# Patient Record
Sex: Male | Born: 1935 | Race: White | Hispanic: No | Marital: Married | Smoking: Former smoker
Health system: Southern US, Community
[De-identification: ages and names within clinical notes are randomized; demographics above are authoritative.]

## PROBLEM LIST (undated history)

## (undated) DIAGNOSIS — E039 Hypothyroidism, unspecified: Secondary | ICD-10-CM

## (undated) DIAGNOSIS — F419 Anxiety disorder, unspecified: Secondary | ICD-10-CM

## (undated) DIAGNOSIS — R9439 Abnormal result of other cardiovascular function study: Secondary | ICD-10-CM

## (undated) DIAGNOSIS — F329 Major depressive disorder, single episode, unspecified: Secondary | ICD-10-CM

## (undated) DIAGNOSIS — M1990 Unspecified osteoarthritis, unspecified site: Secondary | ICD-10-CM

## (undated) DIAGNOSIS — K219 Gastro-esophageal reflux disease without esophagitis: Secondary | ICD-10-CM

## (undated) DIAGNOSIS — E785 Hyperlipidemia, unspecified: Secondary | ICD-10-CM

## (undated) DIAGNOSIS — I10 Essential (primary) hypertension: Secondary | ICD-10-CM

## (undated) DIAGNOSIS — F32A Depression, unspecified: Secondary | ICD-10-CM

## (undated) DIAGNOSIS — I639 Cerebral infarction, unspecified: Secondary | ICD-10-CM

## (undated) HISTORY — DX: Abnormal result of other cardiovascular function study: R94.39

## (undated) HISTORY — DX: Hyperlipidemia, unspecified: E78.5

## (undated) HISTORY — DX: Hypothyroidism, unspecified: E03.9

## (undated) HISTORY — DX: Essential (primary) hypertension: I10

## (undated) HISTORY — DX: Unspecified osteoarthritis, unspecified site: M19.90

---

## 2010-02-04 ENCOUNTER — Ambulatory Visit: Payer: Self-pay | Admitting: Surgery

## 2010-02-04 ENCOUNTER — Ambulatory Visit: Admission: RE | Admit: 2010-02-04 | Discharge: 2010-02-04 | Payer: Self-pay | Admitting: Orthopedic Surgery

## 2010-02-17 DIAGNOSIS — R9439 Abnormal result of other cardiovascular function study: Secondary | ICD-10-CM

## 2010-02-17 HISTORY — PX: POSTERIOR LAMINECTOMY / DECOMPRESSION LUMBAR SPINE: SUR740

## 2010-02-17 HISTORY — DX: Abnormal result of other cardiovascular function study: R94.39

## 2010-03-18 ENCOUNTER — Observation Stay (HOSPITAL_COMMUNITY): Admission: RE | Admit: 2010-03-18 | Discharge: 2010-03-19 | Payer: Self-pay | Admitting: Orthopedic Surgery

## 2010-12-05 LAB — COMPREHENSIVE METABOLIC PANEL
BUN: 32 mg/dL — ABNORMAL HIGH (ref 6–23)
CO2: 25 mEq/L (ref 19–32)
GFR calc Af Amer: 47 mL/min — ABNORMAL LOW (ref >60)
Glucose, Bld: 112 mg/dL — ABNORMAL HIGH (ref 70–99)
Potassium: 4.7 mEq/L (ref 3.5–5.1)
Sodium: 140 mEq/L (ref 135–145)

## 2010-12-05 LAB — CBC
MCV: 98.1 fL (ref 78.0–100.0)
Platelets: 163 10*3/uL (ref 150–400)
RDW: 13.4 % (ref 11.5–15.5)
WBC: 7.4 10*3/uL (ref 4.0–10.5)

## 2010-12-05 LAB — SURGICAL PCR SCREEN: Staphylococcus aureus: NEGATIVE

## 2013-05-08 ENCOUNTER — Encounter: Payer: Self-pay | Admitting: Cardiology

## 2013-05-09 ENCOUNTER — Encounter: Payer: Self-pay | Admitting: Cardiovascular Disease

## 2013-05-10 ENCOUNTER — Encounter: Payer: Self-pay | Admitting: Cardiovascular Disease

## 2013-05-10 ENCOUNTER — Ambulatory Visit (INDEPENDENT_AMBULATORY_CARE_PROVIDER_SITE_OTHER): Payer: Medicare Other | Admitting: Cardiovascular Disease

## 2013-05-10 VITALS — BP 120/82 | HR 69 | Ht 67.0 in | Wt 174.4 lb

## 2013-05-10 DIAGNOSIS — Z01810 Encounter for preprocedural cardiovascular examination: Secondary | ICD-10-CM

## 2013-05-10 DIAGNOSIS — M109 Gout, unspecified: Secondary | ICD-10-CM

## 2013-05-10 DIAGNOSIS — I10 Essential (primary) hypertension: Secondary | ICD-10-CM

## 2013-05-10 DIAGNOSIS — E785 Hyperlipidemia, unspecified: Secondary | ICD-10-CM

## 2013-05-10 NOTE — Patient Instructions (Addendum)
Your physician recommends that you schedule a follow-up appointment in: AS NEEDED  Surgical clearance has been faxed to your surgeon.

## 2013-05-14 ENCOUNTER — Encounter: Payer: Self-pay | Admitting: Cardiovascular Disease

## 2013-05-14 ENCOUNTER — Encounter (HOSPITAL_COMMUNITY): Payer: Self-pay | Admitting: Pharmacy Technician

## 2013-05-14 DIAGNOSIS — E785 Hyperlipidemia, unspecified: Secondary | ICD-10-CM | POA: Insufficient documentation

## 2013-05-14 DIAGNOSIS — M109 Gout, unspecified: Secondary | ICD-10-CM | POA: Insufficient documentation

## 2013-05-14 DIAGNOSIS — Z01810 Encounter for preprocedural cardiovascular examination: Secondary | ICD-10-CM | POA: Insufficient documentation

## 2013-05-14 DIAGNOSIS — I10 Essential (primary) hypertension: Secondary | ICD-10-CM | POA: Insufficient documentation

## 2013-05-14 NOTE — Assessment & Plan Note (Signed)
From a cardiovascular standpoint the plan surgery is low risk. He did quite well with a similar surgery performed 3 years ago. In the interval has been quite active despite his orthopedic limitations. He has no symptoms of shortness of breath or chest pain. Of note previous attempts at treatment with a beta blocker led to dizziness even when a very low dose was used. Low risk of cardiovascular complications of the planned surgery. I do not think additional evaluation is necessary.

## 2013-05-14 NOTE — Assessment & Plan Note (Signed)
Excellent control.   

## 2013-05-14 NOTE — Progress Notes (Signed)
Patient ID: Gilbert Maxwell, male   DOB: 01-23-1933, 79 y.o.   MRN: [id number]     Reason for office visit Reoperative cardiovascular exam, hypertension, hyperlipidemia  This is Gilbert Maxwell's first visit to our office in over 3 years. Was last seen by Dr. Lynnea Ferrier on 04/14/2010.  At that time, he was seen as a preoperative cardiovascular exam before lumbar spine surgery. He underwent a two-level decompression at L4-L5 and L5-S1 without any cardiac events. Around that time he underwent a stress nuclear perfusion study that demonstrated possible mild reversible inferior wall ischemia but the findings were felt to be low risk and coronary angiography was not performed. An echocardiogram was also done and showed normal findings. He has well treated hypertension and hyperlipidemia  Since 2011 Gilbert Maxwell has been a very active. His back surgery worked quite well and he has been walking daily and mowing his lawn until recently when he has started having back problems again. It seems to require surgery a little bit higher up in his lumbar spine again. He did not have any problems with shortness of breath or chest pain over the last 3 years.    Allergies  Allergen Reactions  . Ace Inhibitors Nausea Only  . Beta Adrenergic Blockers Nausea Only    dizzy    Current Outpatient Prescriptions  Medication Sig Dispense Refill  . aspirin 81 MG tablet Take 81 mg by mouth daily.      . calcium carbonate 1250 MG capsule Take 1,250 mg by mouth daily.       . fish oil-omega-3 fatty acids 1000 MG capsule Take 1 g by mouth daily.      . hydrochlorothiazide (HYDRODIURIL) 25 MG tablet Take 25 mg by mouth every morning.       Marland Kitchen ibuprofen (ADVIL,MOTRIN) 200 MG tablet Take 200 mg by mouth every 6 (six) hours as needed for pain.      Marland Kitchen levothyroxine (SYNTHROID, LEVOTHROID) 112 MCG tablet Take 112 mcg by mouth daily before breakfast.      . Multiple Vitamin (MULTIVITAMIN) tablet Take 1 tablet by mouth daily.      Marland Kitchen  olmesartan (BENICAR) 40 MG tablet Take 40 mg by mouth every morning.       Marland Kitchen acetaminophen (TYLENOL) 500 MG tablet Take 500 mg by mouth every 6 (six) hours as needed for pain.      Marland Kitchen atorvastatin (LIPITOR) 20 MG tablet Take 20 mg by mouth every morning.       No current facility-administered medications for this visit.    Past Medical History  Diagnosis Date  . HTN (hypertension)   . Dyslipidemia   . DJD (degenerative joint disease)   . Abnormal cardiovascular stress test 6/11    No symptoms- medical Rx  . Hypothyroid     Past Surgical History  Procedure Laterality Date  . Posterior laminectomy / decompression lumbar spine  June 2011    Family History  Problem Relation Age of Onset  . Heart disease Father     died 31  . Cancer Mother     died 45    History   Social History  . Marital Status: Married    Spouse Name: N/A    Number of Children: N/A  . Years of Education: N/A   Occupational History  . Not on file.   Social History Main Topics  . Smoking status: Former Games developer  . Smokeless tobacco: Not on file  . Alcohol Use: Yes  Comment: 1-2 daily  . Drug Use: No  . Sexual Activity: Not on file   Other Topics Concern  . Not on file   Social History Narrative  . No narrative on file    Review of systems: The patient specifically denies any chest pain at rest or with exertion, dyspnea at rest or with exertion, orthopnea, paroxysmal nocturnal dyspnea, syncope, palpitations, focal neurological deficits, intermittent claudication, lower extremity edema, unexplained weight gain, cough, hemoptysis or wheezing.  The patient also denies abdominal pain, nausea, vomiting, dysphagia, diarrhea, constipation, polyuria, polydipsia, dysuria, hematuria, frequency, urgency, abnormal bleeding or bruising, fever, chills, unexpected weight changes, mood swings, change in skin or hair texture, change in voice quality, auditory or visual problems, allergic reactions or rashes, new  musculoskeletal complaints other than back pain and sciatica.   PHYSICAL EXAM BP 120/82  Pulse 69  Ht 5\' 7"  (1.702 m)  Wt 174 lb 6.4 oz (79.107 kg)  BMI 27.31 kg/m2  General: Alert, oriented x3, no distress; he appears quite fit and has more muscle mass than the average person has a Head: no evidence of trauma, PERRL, EOMI, no exophtalmos or lid lag, no myxedema, no xanthelasma; normal ears, nose and oropharynx Neck: normal jugular venous pulsations and no hepatojugular reflux; brisk carotid pulses without delay and no carotid bruits Chest: clear to auscultation, no signs of consolidation by percussion or palpation, normal fremitus, symmetrical and full respiratory excursions Cardiovascular: normal position and quality of the apical impulse, regular rhythm, normal first and second heart sounds, no murmurs, rubs or gallops Abdomen: no tenderness or distention, no masses by palpation, no abnormal pulsatility or arterial bruits, normal bowel sounds, no hepatosplenomegaly Extremities: no clubbing, cyanosis or edema; 2+ radial, ulnar and brachial pulses bilaterally; 2+ right femoral, posterior tibial and dorsalis pedis pulses; 2+ left femoral, posterior tibial and dorsalis pedis pulses; no subclavian or femoral bruits Neurological: grossly nonfocal   EKG: Normal sinus rhythm with a single PVC, and no repolarization abnormality  Lipid Panel  No results found for this basename: chol, trig, hdl, cholhdl, vldl, ldlcalc    BMET    Component Value Date/Time   NA 140 03/09/2010 1110   K 4.7 03/09/2010 1110   CL 107 03/09/2010 1110   CO2 25 03/09/2010 1110   GLUCOSE 112* 03/09/2010 1110   BUN 32* 03/09/2010 1110   CREATININE 1.74* 03/09/2010 1110   CALCIUM 9.8 03/09/2010 1110   GFRNONAA 38* 03/09/2010 1110   GFRAA  Value: 47        The eGFR has been calculated using the MDRD equation. This calculation has not been validated in all clinical situations. eGFR's persistently <60 mL/min signify possible  Chronic Kidney Disease.* 03/09/2010 1110     ASSESSMENT AND PLAN Preoperative cardiovascular examination From a cardiovascular standpoint the plan surgery is low risk. He did quite well with a similar surgery performed 3 years ago. In the interval has been quite active despite his orthopedic limitations. He has no symptoms of shortness of breath or chest pain. Of note previous attempts at treatment with a beta blocker led to dizziness even when a very low dose was used. Low risk of cardiovascular complications of the planned surgery. I do not think additional evaluation is necessary.  Hyperlipidemia On appropriate statin therapy; unfortunately his most recent lipid profile is not available for review  HTN (hypertension) Excellent control   No orders of the defined types were placed in this encounter.   Meds ordered this encounter  Medications  .  levothyroxine (SYNTHROID, LEVOTHROID) 112 MCG tablet    Sig: Take 112 mcg by mouth daily before breakfast.  . calcium carbonate 1250 MG capsule    Sig: Take 1,250 mg by mouth daily.   . Multiple Vitamin (MULTIVITAMIN) tablet    Sig: Take 1 tablet by mouth daily.    Junious Silk, MD, [facility] [phone] office [phone] pager

## 2013-05-14 NOTE — Assessment & Plan Note (Signed)
On appropriate statin therapy; unfortunately his most recent lipid profile is not available for review

## 2013-05-15 NOTE — Progress Notes (Signed)
Dr Darrelyn Hillock-  Need PRE OP ORDERS PLEASE- has appt PST 05/22/13  Thanks

## 2013-05-21 NOTE — Patient Instructions (Signed)
Gilbert Maxwell  05/21/2013   Your procedure is scheduled on:  05/29/13               Surgery 1130am-130pm  Report to [facility] at   0900  AM.  Call this number if you have problems the morning of surgery: [phone]   Remember:   Do not eat food or drink liquids after midnight.   Take these medicines the morning of surgery with A SIP OF WATER:    Do not wear jewelry, make-up or nail polish.  Do not wear lotions, powders, or perfumes.    Men may shave face and neck.  Do not bring valuables to the hospital.  Contacts, dentures or bridgework may not be worn into surgery.  Leave suitcase in the car. After surgery it may be brought to your room.  For patients admitted to the hospital, checkout time is 11:00 AM the day of  discharge.     SEE CHG INSTRUCTION SHEET    Please read over the following fact sheets that you were given: MRSA Information, coughing and deep breathing exercises, leg exercises, Incentive Spirometry Fact Sheet                Failure to comply with these instructions may result in cancellation of your surgery.                Patient Signature ____________________________              Nurse Signature _____________________________

## 2013-05-22 ENCOUNTER — Encounter (HOSPITAL_COMMUNITY)
Admission: RE | Admit: 2013-05-22 | Discharge: 2013-05-22 | Disposition: A | Discharge status: Home / Self Care | Payer: Medicare Other | Source: Ambulatory Visit | Attending: Orthopedic Surgery | Admitting: Orthopedic Surgery

## 2013-05-22 ENCOUNTER — Ambulatory Visit (HOSPITAL_COMMUNITY)
Admission: RE | Admit: 2013-05-22 | Discharge: 2013-05-22 | Disposition: A | Discharge status: Home / Self Care | Payer: Medicare Other | Source: Ambulatory Visit | Attending: Surgical | Admitting: Surgical

## 2013-05-22 ENCOUNTER — Encounter (HOSPITAL_COMMUNITY): Payer: Self-pay

## 2013-05-22 DIAGNOSIS — M48061 Spinal stenosis, lumbar region without neurogenic claudication: Secondary | ICD-10-CM | POA: Insufficient documentation

## 2013-05-22 DIAGNOSIS — Z01812 Encounter for preprocedural laboratory examination: Secondary | ICD-10-CM | POA: Insufficient documentation

## 2013-05-22 DIAGNOSIS — M4310 Spondylolisthesis, site unspecified: Secondary | ICD-10-CM | POA: Insufficient documentation

## 2013-05-22 DIAGNOSIS — M5137 Other intervertebral disc degeneration, lumbosacral region: Secondary | ICD-10-CM | POA: Insufficient documentation

## 2013-05-22 DIAGNOSIS — Z01818 Encounter for other preprocedural examination: Secondary | ICD-10-CM | POA: Insufficient documentation

## 2013-05-22 DIAGNOSIS — M51379 Other intervertebral disc degeneration, lumbosacral region without mention of lumbar back pain or lower extremity pain: Secondary | ICD-10-CM | POA: Insufficient documentation

## 2013-05-22 HISTORY — DX: Gastro-esophageal reflux disease without esophagitis: K21.9

## 2013-05-22 HISTORY — DX: Anxiety disorder, unspecified: F41.9

## 2013-05-22 HISTORY — DX: Cerebral infarction, unspecified: I63.9

## 2013-05-22 LAB — COMPREHENSIVE METABOLIC PANEL
ALT: 27 U/L (ref 0–53)
AST: 25 U/L (ref 0–37)
Albumin: 4.2 g/dL (ref 3.5–5.2)
Alkaline Phosphatase: 57 U/L (ref 39–117)
BUN: 25 mg/dL — ABNORMAL HIGH (ref 6–23)
CO2: 28 mEq/L (ref 19–32)
Calcium: 10.2 mg/dL (ref 8.4–10.5)
Chloride: 102 mEq/L (ref 96–112)
Creatinine, Ser: 1.49 mg/dL — ABNORMAL HIGH (ref 0.50–1.35)
GFR calc Af Amer: 50 mL/min — ABNORMAL LOW (ref >90)
GFR calc non Af Amer: 43 mL/min — ABNORMAL LOW (ref >90)
Glucose, Bld: 99 mg/dL (ref 70–99)
Potassium: 4.5 mEq/L (ref 3.5–5.1)
Sodium: 138 mEq/L (ref 135–145)
Total Bilirubin: 0.3 mg/dL (ref 0.3–1.2)
Total Protein: 7.4 g/dL (ref 6.0–8.3)

## 2013-05-22 LAB — URINALYSIS, ROUTINE W REFLEX MICROSCOPIC
Bilirubin Urine: NEGATIVE
Glucose, UA: NEGATIVE mg/dL
Hgb urine dipstick: NEGATIVE
Ketones, ur: NEGATIVE mg/dL
Leukocytes, UA: NEGATIVE
Nitrite: NEGATIVE
Protein, ur: NEGATIVE mg/dL
Specific Gravity, Urine: 1.02 (ref 1.005–1.030)
Urobilinogen, UA: 0.2 mg/dL (ref 0.0–1.0)
pH: 7 (ref 5.0–8.0)

## 2013-05-22 LAB — CBC
MCH: 32.3 pg (ref 26.0–34.0)
MCHC: 33.6 g/dL (ref 30.0–36.0)
MCV: 96 fL (ref 78.0–100.0)
Platelets: 190 10*3/uL (ref 150–400)
RBC: 4.03 MIL/uL — ABNORMAL LOW (ref 4.22–5.81)

## 2013-05-22 LAB — PROTIME-INR
INR: 0.96 (ref 0.00–1.49)
Prothrombin Time: 12.6 seconds (ref 11.6–15.2)

## 2013-05-22 LAB — SURGICAL PCR SCREEN
MRSA, PCR: NEGATIVE
Staphylococcus aureus: NEGATIVE

## 2013-05-22 LAB — APTT: aPTT: 34 seconds (ref 24–37)

## 2013-05-22 NOTE — Progress Notes (Signed)
Stress 05/09/13 EPIC Dr Gwenlyn Saran note- 05/10/13 EPIC

## 2013-05-28 NOTE — H&P (Signed)
Gilbert Maxwell is an 79 y.o. male.   Chief Complaint: back pain HPI: He was referred here today by [facility] for evaluation of right buttock and leg pain going down to his foot. He was seen at that facility on 01/29/13 with a chief complaints of pain for some two to three weeks. He had no injury. He is placed on Flexeril and Sterapred Dosepak. He had an intolerance to the Flexeril. He also had x-rays of the pelvis and lumbar spine taken which I have reviewed today. The hips were normal on the lateral projection of his lumbar spine. He did have a grade 1 spondylolisthesis of L4 and L5 and some fairly prominent degenerative disc disease at L5-S1. Back on 03/18/10 he had decompressive laminectomy by me from L4 to the sacrum with microdiscectomy at L4-5 on the right and inspection of the L5-S1 disc on the right. Dr. Simonne Come originally saw the patient and ordered an MRI of the lumbar spine. He has a central and to the right large disc extrusion at 3-4 that has migrated distalward. The patient had an injection by Dr. Ethelene Hal that lasted for one day.   Past Medical History  Diagnosis Date  . HTN (hypertension)   . Dyslipidemia   . DJD (degenerative joint disease)   . Abnormal cardiovascular stress test 6/11    No symptoms- medical Rx  . Hypothyroid   . Anxiety   . Stroke     small " mini stroke" - 2002   . GERD (gastroesophageal reflux disease)     hx of     Past Surgical History  Procedure Laterality Date  . Posterior laminectomy / decompression lumbar spine  June 2011    Family History  Problem Relation Age of Onset  . Heart disease Father     died 68  . Cancer Mother     died 24   Social History:  reports that he has quit smoking. He has never used smokeless tobacco. He reports that  drinks alcohol. He reports that he does not use illicit drugs.  Allergies:  Allergies  Allergen Reactions  . Ace Inhibitors Nausea Only  . Beta Adrenergic Blockers Nausea Only   dizzy     Current outpatient prescriptions: acetaminophen (TYLENOL) 500 MG tablet, Take 500 mg by mouth every 6 (six) hours as needed for pain., Disp: , Rfl: ;  aspirin 81 MG tablet, Take 81 mg by mouth daily., Disp: , Rfl: ;  atorvastatin (LIPITOR) 20 MG tablet, Take 20 mg by mouth every morning., Disp: , Rfl: ;  calcium carbonate 200 MG capsule, Take 1 capsule by mouth daily. Patient takes [employer] brand Calcium 1000mg  capsule daily, Disp: , Rfl:  fish oil-omega-3 fatty acids 1000 MG capsule, Take 1 g by mouth daily., Disp: , Rfl: ;  hydrochlorothiazide (HYDRODIURIL) 25 MG tablet, Take 25 mg by mouth every morning. , Disp: , Rfl: ;  ibuprofen (ADVIL,MOTRIN) 200 MG tablet, Take 200 mg by mouth every 6 (six) hours as needed for pain., Disp: , Rfl: ;  levothyroxine (SYNTHROID, LEVOTHROID) 112 MCG tablet, Take 112 mcg by mouth daily before breakfast., Disp: , Rfl:  Multiple Vitamin (MULTIVITAMIN) tablet, Take 1 tablet by mouth daily., Disp: , Rfl: ;  olmesartan (BENICAR) 40 MG tablet, Take 40 mg by mouth every morning. , Disp: , Rfl:    Review of Systems  Constitutional: Negative.   HENT: Negative.  Negative for neck pain.   Eyes: Negative.   Respiratory: Negative.  Cardiovascular: Negative.   Gastrointestinal: Positive for heartburn. Negative for nausea, vomiting, abdominal pain, diarrhea, constipation, blood in stool and melena.  Genitourinary: Negative.   Musculoskeletal: Positive for back pain and joint pain. Negative for myalgias and falls.  Skin: Negative.   Neurological: Positive for sensory change. Negative for dizziness, tingling, tremors, speech change, focal weakness, seizures and loss of consciousness.  Endo/Heme/Allergies: Negative.   Psychiatric/Behavioral: Negative.     Vitals Weight: 175 lb Height: 66.5 in Body Surface Area: 1.93 m Body Mass Index: 27.82 kg/m Pulse: 72 (Regular) BP: 122/76 (Sitting, Left Arm, Standard)  Physical Exam  Constitutional: He is  oriented to person, place, and time. He appears well-developed and well-nourished. No distress.  HENT:  Head: Normocephalic and atraumatic.  Right Ear: External ear normal.  Left Ear: External ear normal.  Nose: Nose normal.  Mouth/Throat: Oropharynx is clear and moist.  Eyes: Conjunctivae and EOM are normal.  Neck: Normal range of motion. Neck supple. No tracheal deviation present. No thyromegaly present.  Cardiovascular: Normal rate, regular rhythm, normal heart sounds and intact distal pulses.   No murmur heard. Respiratory: Effort normal and breath sounds normal. No respiratory distress. He has no wheezes.  GI: Soft. Bowel sounds are normal. He exhibits no distension. There is no tenderness.  Musculoskeletal:       Right hip: Normal.       Left hip: Normal.       Right knee: Normal.       Left knee: Normal.       Lumbar back: He exhibits decreased range of motion, tenderness, pain and spasm.       Right lower leg: He exhibits no tenderness and no swelling.       Left lower leg: He exhibits no tenderness and no swelling.  Neurological: He is alert and oriented to person, place, and time. He has normal strength and normal reflexes. A sensory deficit is present.  Slightly diminished sensation in the plantar surface of his right foot.  Skin: No rash noted. He is not diaphoretic. No erythema.  Psychiatric: He has a normal mood and affect. His behavior is normal.     Assessment/Plan Lumbar spinal stenosis, lumbar disc herniation L3-L4 He needs to have a central decompressive lumbar laminectomy at L3-4 and microdiscectomy at L3-4 on the right. Risks and benefits of the surgery were discussed with the patient.   Bobi Daudelin LAUREN 05/28/2013, 8:58 AM

## 2013-05-29 ENCOUNTER — Inpatient Hospital Stay (HOSPITAL_COMMUNITY): Payer: Medicare Other

## 2013-05-29 ENCOUNTER — Encounter (HOSPITAL_COMMUNITY)
Admission: RE | Disposition: A | Discharge status: Home / Self Care | Payer: Self-pay | Source: Ambulatory Visit | Attending: Orthopedic Surgery

## 2013-05-29 ENCOUNTER — Inpatient Hospital Stay (HOSPITAL_COMMUNITY)
Admission: RE | Admit: 2013-05-29 | Discharge: 2013-05-31 | DRG: 491 | Disposition: A | Discharge status: Home / Self Care | Payer: Medicare Other | Source: Ambulatory Visit | Attending: Orthopedic Surgery | Admitting: Orthopedic Surgery

## 2013-05-29 ENCOUNTER — Encounter (HOSPITAL_COMMUNITY): Payer: Self-pay

## 2013-05-29 ENCOUNTER — Encounter (HOSPITAL_COMMUNITY): Payer: Self-pay | Admitting: Anesthesiology

## 2013-05-29 ENCOUNTER — Inpatient Hospital Stay (HOSPITAL_COMMUNITY): Payer: Medicare Other | Admitting: Anesthesiology

## 2013-05-29 DIAGNOSIS — M51379 Other intervertebral disc degeneration, lumbosacral region without mention of lumbar back pain or lower extremity pain: Secondary | ICD-10-CM | POA: Diagnosis present

## 2013-05-29 DIAGNOSIS — M5126 Other intervertebral disc displacement, lumbar region: Principal | ICD-10-CM | POA: Diagnosis present

## 2013-05-29 DIAGNOSIS — M5137 Other intervertebral disc degeneration, lumbosacral region: Secondary | ICD-10-CM | POA: Diagnosis present

## 2013-05-29 DIAGNOSIS — E039 Hypothyroidism, unspecified: Secondary | ICD-10-CM | POA: Diagnosis present

## 2013-05-29 DIAGNOSIS — R339 Retention of urine, unspecified: Secondary | ICD-10-CM | POA: Diagnosis not present

## 2013-05-29 DIAGNOSIS — K219 Gastro-esophageal reflux disease without esophagitis: Secondary | ICD-10-CM | POA: Diagnosis present

## 2013-05-29 DIAGNOSIS — I10 Essential (primary) hypertension: Secondary | ICD-10-CM | POA: Diagnosis present

## 2013-05-29 DIAGNOSIS — Z8673 Personal history of transient ischemic attack (TIA), and cerebral infarction without residual deficits: Secondary | ICD-10-CM

## 2013-05-29 DIAGNOSIS — R338 Other retention of urine: Secondary | ICD-10-CM | POA: Diagnosis present

## 2013-05-29 DIAGNOSIS — M48062 Spinal stenosis, lumbar region with neurogenic claudication: Secondary | ICD-10-CM | POA: Diagnosis present

## 2013-05-29 DIAGNOSIS — E785 Hyperlipidemia, unspecified: Secondary | ICD-10-CM | POA: Diagnosis present

## 2013-05-29 DIAGNOSIS — Z87891 Personal history of nicotine dependence: Secondary | ICD-10-CM

## 2013-05-29 HISTORY — PX: LUMBAR LAMINECTOMY/DECOMPRESSION MICRODISCECTOMY: SHX5026

## 2013-05-29 SURGERY — LUMBAR LAMINECTOMY/DECOMPRESSION MICRODISCECTOMY 1 LEVEL
Anesthesia: General | Site: Back | Wound class: Clean

## 2013-05-29 MED ORDER — OXYCODONE-ACETAMINOPHEN 5-325 MG PO TABS
1.0000 | ORAL_TABLET | ORAL | Status: DC | PRN
  Administered 2013-05-29 – 2013-05-31 (×9): 2 via ORAL
  Filled 2013-05-29 (×9): qty 2

## 2013-05-29 MED ORDER — HYDROMORPHONE HCL PF 1 MG/ML IJ SOLN
INTRAMUSCULAR | Status: AC
  Filled 2013-05-29: qty 1

## 2013-05-29 MED ORDER — LACTATED RINGERS IV SOLN: INTRAVENOUS | Status: DC

## 2013-05-29 MED ORDER — BISACODYL 10 MG RE SUPP: 10.0000 mg | Freq: Every day | RECTAL | Status: DC | PRN

## 2013-05-29 MED ORDER — CEFAZOLIN SODIUM-DEXTROSE 2-3 GM-% IV SOLR: 2.0000 g | INTRAVENOUS | Status: DC

## 2013-05-29 MED ORDER — GLYCOPYRROLATE 0.2 MG/ML IJ SOLN
INTRAMUSCULAR | Status: DC | PRN
  Administered 2013-05-29: .7 mg via INTRAVENOUS

## 2013-05-29 MED ORDER — CEFAZOLIN SODIUM-DEXTROSE 2-3 GM-% IV SOLR
INTRAVENOUS | Status: DC | PRN
  Administered 2013-05-29: 2 g via INTRAVENOUS

## 2013-05-29 MED ORDER — POLYETHYLENE GLYCOL 3350 17 G PO PACK: 17.0000 g | PACK | Freq: Every day | ORAL | Status: DC | PRN

## 2013-05-29 MED ORDER — BUPIVACAINE-EPINEPHRINE PF 0.25-1:200000 % IJ SOLN
INTRAMUSCULAR | Status: AC
  Filled 2013-05-29: qty 30

## 2013-05-29 MED ORDER — DEXTROSE 5 % IV SOLN
500.0000 mg | Freq: Four times a day (QID) | INTRAVENOUS | Status: DC | PRN
  Administered 2013-05-29: 500 mg via INTRAVENOUS
  Filled 2013-05-29: qty 5

## 2013-05-29 MED ORDER — LEVOTHYROXINE SODIUM 112 MCG PO TABS
112.0000 ug | ORAL_TABLET | Freq: Every day | ORAL | Status: DC
  Administered 2013-05-30 – 2013-05-31 (×2): 112 ug via ORAL
  Filled 2013-05-29 (×3): qty 1

## 2013-05-29 MED ORDER — CEFAZOLIN SODIUM 1-5 GM-% IV SOLN
1.0000 g | Freq: Three times a day (TID) | INTRAVENOUS | Status: AC
  Administered 2013-05-29 – 2013-05-30 (×3): 1 g via INTRAVENOUS
  Filled 2013-05-29 (×3): qty 50

## 2013-05-29 MED ORDER — BUPIVACAINE-EPINEPHRINE 0.25% -1:200000 IJ SOLN
INTRAMUSCULAR | Status: DC | PRN
  Administered 2013-05-29: 20 mL

## 2013-05-29 MED ORDER — HYDROCODONE-ACETAMINOPHEN 5-325 MG PO TABS: 1.0000 | ORAL_TABLET | ORAL | Status: DC | PRN

## 2013-05-29 MED ORDER — HYDROCHLOROTHIAZIDE 25 MG PO TABS
25.0000 mg | ORAL_TABLET | Freq: Every morning | ORAL | Status: DC
  Filled 2013-05-29 (×2): qty 1

## 2013-05-29 MED ORDER — LACTATED RINGERS IV SOLN
INTRAVENOUS | Status: DC | PRN
  Administered 2013-05-29 (×2): via INTRAVENOUS

## 2013-05-29 MED ORDER — ROCURONIUM BROMIDE 100 MG/10ML IV SOLN
INTRAVENOUS | Status: DC | PRN
  Administered 2013-05-29: 10 mg via INTRAVENOUS
  Administered 2013-05-29: 5 mg via INTRAVENOUS
  Administered 2013-05-29: 50 mg via INTRAVENOUS

## 2013-05-29 MED ORDER — HYDROMORPHONE HCL PF 1 MG/ML IJ SOLN: 0.5000 mg | INTRAMUSCULAR | Status: DC | PRN

## 2013-05-29 MED ORDER — BACITRACIN ZINC 500 UNIT/GM EX OINT
TOPICAL_OINTMENT | CUTANEOUS | Status: AC
  Filled 2013-05-29: qty 28.35

## 2013-05-29 MED ORDER — MIDAZOLAM HCL 5 MG/5ML IJ SOLN
INTRAMUSCULAR | Status: DC | PRN
  Administered 2013-05-29: 2 mg via INTRAVENOUS

## 2013-05-29 MED ORDER — BUPIVACAINE LIPOSOME 1.3 % IJ SUSP
20.0000 mL | Freq: Once | INTRAMUSCULAR | Status: DC
  Filled 2013-05-29: qty 20

## 2013-05-29 MED ORDER — CEFAZOLIN SODIUM-DEXTROSE 2-3 GM-% IV SOLR
INTRAVENOUS | Status: AC
  Filled 2013-05-29: qty 50

## 2013-05-29 MED ORDER — LIDOCAINE HCL (CARDIAC) 20 MG/ML IV SOLN
INTRAVENOUS | Status: DC | PRN
  Administered 2013-05-29: 50 mg via INTRAVENOUS

## 2013-05-29 MED ORDER — FLEET ENEMA 7-19 GM/118ML RE ENEM: 1.0000 | ENEMA | Freq: Once | RECTAL | Status: AC | PRN

## 2013-05-29 MED ORDER — MENTHOL 3 MG MT LOZG: 1.0000 | LOZENGE | OROMUCOSAL | Status: DC | PRN

## 2013-05-29 MED ORDER — THROMBIN 5000 UNITS EX SOLR
CUTANEOUS | Status: DC | PRN
  Administered 2013-05-29 (×2): 5000 [IU] via TOPICAL

## 2013-05-29 MED ORDER — IRBESARTAN 75 MG PO TABS
75.0000 mg | ORAL_TABLET | Freq: Every day | ORAL | Status: DC
  Administered 2013-05-29: 22:00:00 75 mg via ORAL
  Filled 2013-05-29 (×3): qty 1

## 2013-05-29 MED ORDER — BACITRACIN-NEOMYCIN-POLYMYXIN 400-5-5000 EX OINT
TOPICAL_OINTMENT | CUTANEOUS | Status: DC | PRN
  Administered 2013-05-29: 1 via TOPICAL

## 2013-05-29 MED ORDER — PROPOFOL 10 MG/ML IV BOLUS
INTRAVENOUS | Status: DC | PRN
  Administered 2013-05-29: 100 mg via INTRAVENOUS

## 2013-05-29 MED ORDER — ONDANSETRON HCL 4 MG/2ML IJ SOLN: 4.0000 mg | INTRAMUSCULAR | Status: DC | PRN

## 2013-05-29 MED ORDER — NEOSTIGMINE METHYLSULFATE 1 MG/ML IJ SOLN
INTRAMUSCULAR | Status: DC | PRN
  Administered 2013-05-29: 4 mg via INTRAVENOUS

## 2013-05-29 MED ORDER — THROMBIN 5000 UNITS EX SOLR
CUTANEOUS | Status: AC
  Filled 2013-05-29: qty 10000

## 2013-05-29 MED ORDER — BUPIVACAINE LIPOSOME 1.3 % IJ SUSP
INTRAMUSCULAR | Status: DC | PRN
  Administered 2013-05-29: 20 mL

## 2013-05-29 MED ORDER — SODIUM CHLORIDE 0.9 % IR SOLN
Status: DC | PRN
  Administered 2013-05-29: 14:00:00

## 2013-05-29 MED ORDER — ATORVASTATIN CALCIUM 20 MG PO TABS
20.0000 mg | ORAL_TABLET | Freq: Every evening | ORAL | Status: DC
  Administered 2013-05-30: 20 mg via ORAL
  Filled 2013-05-29 (×2): qty 1

## 2013-05-29 MED ORDER — HYDROMORPHONE HCL PF 1 MG/ML IJ SOLN
0.2500 mg | INTRAMUSCULAR | Status: DC | PRN
  Administered 2013-05-29 (×4): 0.5 mg via INTRAVENOUS

## 2013-05-29 MED ORDER — ACETAMINOPHEN 650 MG RE SUPP: 650.0000 mg | RECTAL | Status: DC | PRN

## 2013-05-29 MED ORDER — LACTATED RINGERS IV SOLN
INTRAVENOUS | Status: DC
  Administered 2013-05-29 – 2013-05-30 (×2): via INTRAVENOUS

## 2013-05-29 MED ORDER — ACETAMINOPHEN 325 MG PO TABS: 650.0000 mg | ORAL_TABLET | ORAL | Status: DC | PRN

## 2013-05-29 MED ORDER — EPHEDRINE SULFATE 50 MG/ML IJ SOLN
INTRAMUSCULAR | Status: DC | PRN
  Administered 2013-05-29 (×2): 5 mg via INTRAVENOUS

## 2013-05-29 MED ORDER — PHENOL 1.4 % MT LIQD: 1.0000 | OROMUCOSAL | Status: DC | PRN

## 2013-05-29 MED ORDER — LACTATED RINGERS IV SOLN
INTRAVENOUS | Status: DC
  Administered 2013-05-29: 1000 mL via INTRAVENOUS

## 2013-05-29 MED ORDER — FENTANYL CITRATE 0.05 MG/ML IJ SOLN
INTRAMUSCULAR | Status: DC | PRN
  Administered 2013-05-29 (×4): 50 ug via INTRAVENOUS

## 2013-05-29 MED ORDER — ONDANSETRON HCL 4 MG/2ML IJ SOLN
INTRAMUSCULAR | Status: DC | PRN
  Administered 2013-05-29: 4 mg via INTRAVENOUS

## 2013-05-29 MED ORDER — METHOCARBAMOL 500 MG PO TABS
500.0000 mg | ORAL_TABLET | Freq: Four times a day (QID) | ORAL | Status: DC | PRN
  Administered 2013-05-29 – 2013-05-31 (×5): 500 mg via ORAL
  Filled 2013-05-29 (×5): qty 1

## 2013-05-29 SURGICAL SUPPLY — 45 items
BAG ZIPLOCK 12X15 (MISCELLANEOUS) ×2 IMPLANT
BENZOIN TINCTURE PRP APPL 2/3 (GAUZE/BANDAGES/DRESSINGS) ×2 IMPLANT
CLEANER TIP ELECTROSURG 2X2 (MISCELLANEOUS) ×2 IMPLANT
CLOTH BEACON ORANGE TIMEOUT ST (SAFETY) ×2 IMPLANT
CONT SPECI 4OZ STER CLIK (MISCELLANEOUS) IMPLANT
DRAIN PENROSE 18X1/4 LTX STRL (WOUND CARE) IMPLANT
DRAPE LG THREE QUARTER DISP (DRAPES) ×2 IMPLANT
DRAPE MICROSCOPE LEICA (MISCELLANEOUS) ×2 IMPLANT
DRAPE POUCH INSTRU U-SHP 10X18 (DRAPES) ×2 IMPLANT
DRAPE SURG 17X11 SM STRL (DRAPES) ×2 IMPLANT
DRSG ADAPTIC 3X8 NADH LF (GAUZE/BANDAGES/DRESSINGS) ×2 IMPLANT
DRSG PAD ABDOMINAL 8X10 ST (GAUZE/BANDAGES/DRESSINGS) ×6 IMPLANT
DURAPREP 26ML APPLICATOR (WOUND CARE) ×2 IMPLANT
ELECT REM PT RETURN 9FT ADLT (ELECTROSURGICAL) ×2
ELECTRODE REM PT RTRN 9FT ADLT (ELECTROSURGICAL) ×1 IMPLANT
GLOVE BIOGEL PI IND STRL 8 (GLOVE) ×2 IMPLANT
GLOVE BIOGEL PI INDICATOR 8 (GLOVE) ×2
GLOVE ECLIPSE 8.0 STRL XLNG CF (GLOVE) ×4 IMPLANT
GOWN PREVENTION PLUS LG XLONG (DISPOSABLE) ×6 IMPLANT
GOWN STRL REIN XL XLG (GOWN DISPOSABLE) ×4 IMPLANT
KIT BASIN OR (CUSTOM PROCEDURE TRAY) ×2 IMPLANT
KIT POSITIONING SURG ANDREWS (MISCELLANEOUS) ×2 IMPLANT
MANIFOLD NEPTUNE II (INSTRUMENTS) ×2 IMPLANT
NEEDLE SPNL 18GX3.5 QUINCKE PK (NEEDLE) ×8 IMPLANT
NS IRRIG 1000ML POUR BTL (IV SOLUTION) ×2 IMPLANT
PATTIES SURGICAL .5 X.5 (GAUZE/BANDAGES/DRESSINGS) IMPLANT
PATTIES SURGICAL .75X.75 (GAUZE/BANDAGES/DRESSINGS) IMPLANT
PATTIES SURGICAL 1X1 (DISPOSABLE) IMPLANT
PIN SAFETY NICK PLATE  2 MED (MISCELLANEOUS)
PIN SAFETY NICK PLATE 2 MED (MISCELLANEOUS) IMPLANT
POSITIONER SURGICAL ARM (MISCELLANEOUS) ×2 IMPLANT
SPONGE GAUZE 4X4 12PLY (GAUZE/BANDAGES/DRESSINGS) ×2 IMPLANT
SPONGE LAP 4X18 X RAY DECT (DISPOSABLE) IMPLANT
SPONGE SURGIFOAM ABS GEL 100 (HEMOSTASIS) ×2 IMPLANT
STAPLER VISISTAT 35W (STAPLE) IMPLANT
SUT VIC AB 0 CT1 27 (SUTURE)
SUT VIC AB 0 CT1 27XBRD ANTBC (SUTURE) IMPLANT
SUT VIC AB 1 CT1 27 (SUTURE) ×3
SUT VIC AB 1 CT1 27XBRD ANTBC (SUTURE) ×3 IMPLANT
SYR 30ML LL (SYRINGE) ×2 IMPLANT
SYR CONTROL 10ML LL (SYRINGE) ×2 IMPLANT
TAPE CLOTH SURG 6X10 WHT LF (GAUZE/BANDAGES/DRESSINGS) ×2 IMPLANT
TOWEL OR 17X26 10 PK STRL BLUE (TOWEL DISPOSABLE) ×4 IMPLANT
TRAY LAMINECTOMY (CUSTOM PROCEDURE TRAY) ×2 IMPLANT
WATER STERILE IRR 1500ML POUR (IV SOLUTION) ×2 IMPLANT

## 2013-05-29 NOTE — Anesthesia Preprocedure Evaluation (Addendum)
Anesthesia Evaluation  Patient identified by MRN, date of birth, ID band Patient awake    Reviewed: Allergy & Precautions, H&P , NPO status , Patient's Chart, lab work & pertinent test results  Airway Mallampati: II TM Distance: >3 FB Neck ROM: full    Dental  (+) Edentulous Upper and Edentulous Lower   Pulmonary neg pulmonary ROS,  breath sounds clear to auscultation  Pulmonary exam normal       Cardiovascular hypertension, Pt. on medications Rhythm:regular Rate:Normal  abnormal stress test - medical treatment 2011.  Cardiology cleared him for this surgery,.   Neuro/Psych Anxiety Mimi - stroke 2002 CVA, No Residual Symptoms negative neurological ROS  negative psych ROS   GI/Hepatic negative GI ROS, Neg liver ROS,   Endo/Other  negative endocrine ROSHypothyroidism   Renal/GU negative Renal ROS  negative genitourinary   Musculoskeletal   Abdominal   Peds  Hematology negative hematology ROS (+)   Anesthesia Other Findings   Reproductive/Obstetrics negative OB ROS                          Anesthesia Physical Anesthesia Plan  ASA: II  Anesthesia Plan: General   Post-op Pain Management:    Induction:   Airway Management Planned:   Additional Equipment:   Intra-op Plan:   Post-operative Plan:   Informed Consent: I have reviewed the patients History and Physical, chart, labs and discussed the procedure including the risks, benefits and alternatives for the proposed anesthesia with the patient or authorized representative who has indicated his/her understanding and acceptance.   Dental Advisory Given  Plan Discussed with: CRNA  Anesthesia Plan Comments:         Anesthesia Quick Evaluation

## 2013-05-29 NOTE — Transfer of Care (Signed)
Immediate Anesthesia Transfer of Care Note  Patient: Gilbert Maxwell  Procedure(s) Performed: Procedure(s): COMPLETE DECOMPRESSION L3 - L4 MICRODISCECTOMY L3 - L4 1 LEVEL (N/A)  Patient Location: PACU  Anesthesia Type:General  Level of Consciousness: sedated  Airway & Oxygen Therapy: Patient Spontanous Breathing and Patient connected to face mask oxygen  Post-op Assessment: Report given to PACU RN and Post -op Vital signs reviewed and stable  Post vital signs: Reviewed and stable  Complications: No apparent anesthesia complications

## 2013-05-29 NOTE — Plan of Care (Signed)
Problem: Consults Goal: Diagnosis - Spinal Surgery Decompression laminectomy lumbar 3-4

## 2013-05-29 NOTE — Interval H&P Note (Signed)
History and Physical Interval Note:  05/29/2013 11:39 AM  Gilbert Maxwell  has presented today for surgery, with the diagnosis of spinal stenosis  The various methods of treatment have been discussed with the patient and family. After consideration of risks, benefits and other options for treatment, the patient has consented to  Procedure(s): COMPLETE DECOMPRESSION L3 - L4 MICRODISCECTOMY L3 - L4 1 LEVEL (N/A) as a surgical intervention .  The patient's history has been reviewed, patient examined, no change in status, stable for surgery.  I have reviewed the patient's chart and labs.  Questions were answered to the patient's satisfaction.     Fabrizzio Marcella A

## 2013-05-29 NOTE — Progress Notes (Signed)
Subjective: Day of Surgery Procedure(s) (LRB): COMPLETE DECOMPRESSION L3 - L4 MICRODISCECTOMY L3 - L4 1 LEVEL (N/A) Patient reports pain as 2 on 0-10 scale.  Post-Op check this evening. He has good function in his lowers and has no Post-Op problems.  Objective: Vital signs in last 24 hours: Temp:  [97.4 F (36.3 C)-98.1 F (36.7 C)] 97.6 F (36.4 C) (09/10 1720) Pulse Rate:  [73-98] 90 (09/10 1720) Resp:  [10-19] 18 (09/10 1720) BP: (143-165)/(63-93) 165/93 mmHg (09/10 1720) SpO2:  [97 %-100 %] 100 % (09/10 1720) Weight:  [79.379 kg (175 lb)] 79.379 kg (175 lb) (09/10 1625)  Intake/Output from previous day:   Intake/Output this shift: Total I/O In: 2295 [P.O.:120; I.V.:2125; IV Piggyback:50] Out: 500 [Urine:500]  No results found for this basename: HGB,  in the last 72 hours No results found for this basename: WBC, RBC, HCT, PLT,  in the last 72 hours No results found for this basename: NA, K, CL, CO2, BUN, CREATININE, GLUCOSE, CALCIUM,  in the last 72 hours No results found for this basename: LABPT, INR,  in the last 72 hours  Dorsiflexion/Plantar flexion intact  Assessment/Plan: Day of Surgery Procedure(s) (LRB): COMPLETE DECOMPRESSION L3 - L4 MICRODISCECTOMY L3 - L4 1 LEVEL (N/A) Up with therapy discontinued plans for tomorrow.  Lily Velasquez A 05/29/2013, 5:46 PM

## 2013-05-29 NOTE — Anesthesia Postprocedure Evaluation (Signed)
  Anesthesia Post-op Note  Patient: Gilbert Maxwell  Procedure(s) Performed: Procedure(s) (LRB): COMPLETE DECOMPRESSION L3 - L4 MICRODISCECTOMY L3 - L4 1 LEVEL (N/A)  Patient Location: PACU  Anesthesia Type: General  Level of Consciousness: awake and alert   Airway and Oxygen Therapy: Patient Spontanous Breathing  Post-op Pain: mild  Post-op Assessment: Post-op Vital signs reviewed, Patient's Cardiovascular Status Stable, Respiratory Function Stable, Patent Airway and No signs of Nausea or vomiting  Last Vitals:  Filed Vitals:   05/29/13 1545  BP: 157/72  Pulse: 86  Temp:   Resp: 11    Post-op Vital Signs: stable   Complications: No apparent anesthesia complications

## 2013-05-29 NOTE — Brief Op Note (Signed)
05/29/2013  2:54 PM  PATIENT:  Gilbert Maxwell  79 y.o. male  PRE-OPERATIVE DIAGNOSIS:  spinal stenosis L-3-L-4,L-4-L-5 and Herniated Lumbar Disc at L-3-L-4  POST-OPERATIVE DIAGNOSIS:  spinal stenosis L-3-L-4,L-4-L-5 and Herniated Lumbar Disc at L-3-L-4 on The Right  PROCEDURE:  Procedure(s): COMPLETE DECOMPRESSION L3 - L4 MICRODISCECTOMY L3 - L4 1 LEVEL (N/A) and L-4-L-5 for recurrent Spinal Stenosis  SURGEON:  Surgeon(s) and Role:    * Jacki Cones, MD - Primary    * Javier Docker, MD - Assisting    ASSISTANTS: Paula Libra MD   ANESTHESIA:   general  EBL:  Total I/O In: 1600 [I.V.:1600] Out: -   BLOOD ADMINISTERED:none  DRAINS: none   LOCAL MEDICATIONS USED:  MARCAINE  30cc of 0.25% with EPINEPHRINE and 20cc of EXPAREL  SPECIMEN:  No Specimen  DISPOSITION OF SPECIMEN:  N/A  COUNTS:  YES  TOURNIQUET:  * No tourniquets in log *  DICTATION: .Other Dictation: Dictation Number [id number]  PLAN OF CARE: Admit to inpatient   PATIENT DISPOSITION:  PACU - hemodynamically stable.   Delay start of Pharmacological VTE agent (>24hrs) due to surgical blood loss or risk of bleeding: yes

## 2013-05-30 ENCOUNTER — Encounter (HOSPITAL_COMMUNITY): Payer: Self-pay | Admitting: Orthopedic Surgery

## 2013-05-30 MED ORDER — TAMSULOSIN HCL 0.4 MG PO CAPS
0.4000 mg | ORAL_CAPSULE | Freq: Every day | ORAL | Status: DC
  Administered 2013-05-30: 0.4 mg via ORAL
  Filled 2013-05-30 (×2): qty 1

## 2013-05-30 MED ORDER — METHOCARBAMOL 500 MG PO TABS: 500.0000 mg | ORAL_TABLET | Freq: Four times a day (QID) | ORAL | Status: DC | PRN

## 2013-05-30 MED ORDER — OXYCODONE HCL 5 MG PO TABS: 5.0000 mg | ORAL_TABLET | ORAL | Status: DC | PRN

## 2013-05-30 NOTE — Op Note (Signed)
Gilbert Maxwell, HAGOOD NO.:  [id number]  MEDICAL RECORD NO.:  [id number]  LOCATION:  1612                         FACILITY:  [facility]  PHYSICIAN:  Georges Lynch. Markeesha Char, M.D.DATE OF BIRTH:  May 07, 1933  DATE OF PROCEDURE:  05/29/2013 DATE OF DISCHARGE:                              OPERATIVE REPORT   SURGEON:  Georges Lynch. Darrelyn Hillock, M.D.  ASSISTANT:  Jene Every, M.D.  PREOPERATIVE DIAGNOSIS: 1. Spinal stenosis at L3-4. 2. Herniated disk at L3-4 on the right with right leg pain and right     leg weakness. 3. Recurrent spinal stenosis at L4-5.  POSTOPERATIVE DIAGNOSIS: 1. Spinal stenosis at L3-4. 2. Herniated disk at L3-4 on the right with right leg pain and right     leg weakness. 3. Recurrent spinal stenosis at L4-5.  PROCEDURE:  Under general anesthesia, the patient on the spinal frame, a routine orthopedic prep and draping of the back was carried out.  We first carried out the appropriate time-out.  I also marked the appropriate right side of the back in the holding area, despite the fact that we went central. Two needles were placed in the back.  X-ray was taken.  An incision was made over the L3-4, L4-5 space.  Bleeders identified and cauterized.  I then separated the muscle from the lamina and spinous processes bilaterally.  Self-retaining retractors were inserted.  Prior to that, we inserted a Kocher clamp on the spinous process of L3.  This patient had previous decompression at L4-5 and L5- S1.  We took another x-ray to verify the spinous process at the location of the surgical site.  At this particular time, we then did a central decompressive lumbar laminectomy by removing the L3 spinous process, and did a complete laminectomy.  We went down, brought the microscope in, and following that, we removed all the scar tissue at L4-5 from the previous surgery.  We carefully went out laterally 1st, identified the facet and went out far laterally to decompress the  lateral recess, then we went proximally to decompress the area above.  Once we had a good decompression, we finally isolated the dura.  There was marked scar formation.  We gently teased the scar off the dura and retracted the dura with a D'Errico retractor, continued our dissection proximally and distally and laterally.  We then identified the disk space.  There was a large herniated disk.  Cruciate incision was made in the disk space.  We then utilized the nerve hook to tease the disk material out from under the subligamentous space.  When the decompression was complete, we were able to easily pass the hockey stick distally and all about, and the nerve root and dura now was free.  We thoroughly irrigated out the area, loosely applied some thrombin-soaked Gelfoam, closed the wound layers in usual fashion except I left a small distal and small proximal part of the deep part of the wound open for drainage purposes.  Subcu was closed with 0 Vicryl, skin with metal staples.  Now at the very beginning of procedure, I injected a mixture of 0.25% Marcaine with epinephrine approximately 30 mL into the soft tissue to prevent bleeding.  At the end of procedure, I injected 20 mL of Exparel into the muscle and soft tissue.  We then closed the skin with staples.  Sterile Neosporin dressing was applied.  The patient left the operating room in satisfactory condition.  He had 2 g of IV Ancef preop.          ______________________________ Georges Lynch. Darrelyn Hillock, M.D.     RAG/MEDQ  D:  05/29/2013  T:  05/29/2013  Job:  [id number]

## 2013-05-30 NOTE — Progress Notes (Signed)
Dr Darrelyn Hillock paged about patient urinary retention, with orders noted  D Susann Givens RN

## 2013-05-30 NOTE — Progress Notes (Signed)
   Subjective: 1 Day Post-Op Procedure(s) (LRB): COMPLETE DECOMPRESSION L3 - L4 MICRODISCECTOMY L3 - L4 1 LEVEL (N/A) Patient reports pain as mild.   Patient seen in rounds without Dr. Darrelyn Hillock. Patient is well, and has had no acute complaints or problems. He reports that he slept well last night. No SOB or chest pain. No issues overnight. He reports that his leg pain is improved. He still has some soreness in his back.  Plan is to go Home after hospital stay.  Objective: Vital signs in last 24 hours: Temp:  [97.4 F (36.3 C)-98.9 F (37.2 C)] 98.4 F (36.9 C) (09/11 0553) Pulse Rate:  [73-105] 77 (09/11 0553) Resp:  [10-19] 18 (09/11 0553) BP: (123-165)/(63-93) 124/74 mmHg (09/11 0553) SpO2:  [97 %-100 %] 98 % (09/11 0553) Weight:  [79.379 kg (175 lb)] 79.379 kg (175 lb) (09/10 1625)  Intake/Output from previous day:  Intake/Output Summary (Last 24 hours) at 05/30/13 0804 Last data filed at 05/30/13 0553  Gross per 24 hour  Intake   3195 ml  Output   1725 ml  Net   1470 ml     EXAM General - Patient is Alert and Oriented Extremity - Neurologically intact Dorsiflexion/Plantar flexion intact Dressing/Incision - clean, dry, dressing intact Motor Function - intact, moving foot and toes well on exam.   Past Medical History  Diagnosis Date  . HTN (hypertension)   . Dyslipidemia   . DJD (degenerative joint disease)   . Abnormal cardiovascular stress test 6/11    No symptoms- medical Rx  . Hypothyroid   . Anxiety   . Stroke     small " mini stroke" - 2002   . GERD (gastroesophageal reflux disease)     hx of     Assessment/Plan: 1 Day Post-Op Procedure(s) (LRB): COMPLETE DECOMPRESSION L3 - L4 MICRODISCECTOMY L3 - L4 1 LEVEL (N/A) Active Problems:   Spinal stenosis, lumbar region, with neurogenic claudication  Estimated body mass index is 27.4 kg/(m^2) as calculated from the following:   Height as of this encounter: 5\' 7"  (1.702 m).   Weight as of this encounter:  79.379 kg (175 lb). Advance diet Up with therapy D/C IV fluids Discharge home  Weight-Bearing as tolerated   He is doing well. Will discharge home after PT and after he voids. Discharge instructions discussed.   Chiann Goffredo LAUREN 05/30/2013, 8:04 AM

## 2013-05-30 NOTE — Progress Notes (Signed)
Patient unable to void, bladder scan done 644cc noted. Sharrell Ku RN

## 2013-05-30 NOTE — Care Management Note (Signed)
    Page 1 of 1   05/30/2013     11:41:37 AM   CARE MANAGEMENT NOTE 05/30/2013  Patient:  Gilbert Maxwell, Gilbert Maxwell   Account Number:  [id number]  Date Initiated:  05/30/2013  Documentation initiated by:  Colleen Can  Subjective/Objective Assessment:   dx lumbar spinal stenosis, HNP; decompression microdissectomy     Action/Plan:   CM spoke with patient and family. Plans are for patient to return to his home in [place] where spouse and other family member will be caregivers. He already has RW and commose seat. Will not require HH services.   Anticipated DC Date:  05/30/2013   Anticipated DC Plan:  HOME/SELF CARE      DC Planning Services  CM consult      Choice offered to / List presented to:             Status of service:  Completed, signed off Medicare Important Message given?  NA - LOS <3 / Initial given by admissions (If response is "NO", the following Medicare IM given date fields will be blank) Date Medicare IM given:   Date Additional Medicare IM given:    Discharge Disposition:  HOME/SELF CARE  Per UR Regulation:  Reviewed for med. necessity/level of care/duration of stay  If discussed at Long Length of Stay Meetings, dates discussed:    Comments:

## 2013-05-30 NOTE — Progress Notes (Signed)
In and out performed per Erle Crocker, RN Nusrsing instructer [employer] with 925 cc returned.

## 2013-05-30 NOTE — Evaluation (Signed)
Occupational Therapy Evaluation Patient Details Name: Gilbert Maxwell MRN: [id number] DOB: 12-23-1932 Today's Date: 05/30/2013 Time: 4098-1191 OT Time Calculation (min): 19 min  OT Assessment / Plan / Recommendation History of present illness s/p decompression HNP L3-4, spinal stenosis on 05/29/13   Clinical Impression   Pt presents to OT s/p back surgery. All education complete regarding ADL activity and back precautions    OT Assessment  Patient does not need any further OT services    Follow Up Recommendations  No OT follow up       Equipment Recommendations  None recommended by OT          Precautions / Restrictions Precautions Precautions: Back Precaution Booklet Issued: Yes (comment) Restrictions Weight Bearing Restrictions: No       ADL  Lower Body Dressing: Performed;Minimal assistance Where Assessed - Lower Body Dressing: Unsupported sit to stand Toilet Transfer: Performed;Supervision/safety Toilet Transfer Method: Sit to stand Toilet Transfer Equipment: Comfort height toilet Toileting - Clothing Manipulation and Hygiene: Simulated;Supervision/safety Where Assessed - Engineer, mining and Hygiene: Standing Tub/Shower Transfer: Print production planner: Transfer tub bench Transfers/Ambulation Related to ADLs: Pt has a tub bench at home and has used in the past.  OT educated pt regarding ADL activity with back precautions.  Pts wife and son will A as needed. Pt does have AE at home and has used in past      OT Goals(Current goals can be found in the care plan section) Acute Rehab OT Goals Patient Stated Goal: get home today and be able to go on my walks OT Goal Formulation: With patient  Visit Information  Last OT Received On: 05/30/13 Assistance Needed: +1 History of Present Illness: s/p decompression HNP L3-4, spinal stenosis on 05/29/13       Prior Functioning     Home Living Family/patient expects  to be discharged to:: Private residence Living Arrangements: Spouse/significant other Available Help at Discharge: Family;Available 24 hours/day Type of Home: House Home Access: Stairs to enter Entrance Stairs-Rails: None Home Layout: Two level;Able to live on main level with bedroom/bathroom Home Equipment: Dan Humphreys - 2 wheels;Bedside commode;Shower seat Prior Function Level of Independence: Independent Communication Communication: No difficulties         Vision/Perception Vision - History Patient Visual Report: No change from baseline   Cognition  Cognition Arousal/Alertness: Awake/alert Behavior During Therapy: WFL for tasks assessed/performed Overall Cognitive Status: Within Functional Limits for tasks assessed    Extremity/Trunk Assessment Upper Extremity Assessment Upper Extremity Assessment: Overall WFL for tasks assessed Lower Extremity Assessment Lower Extremity Assessment: Overall WFL for tasks assessed Cervical / Trunk Assessment Cervical / Trunk Assessment: Normal     Mobility Bed Mobility Bed Mobility: Not assessed Rolling Left: 5: Supervision;With rail Left Sidelying to Sit: HOB elevated Details for Bed Mobility Assistance: verbal cues on log rolling, technique to get to side and sitting using back precautions Transfers Transfers: Sit to Stand;Stand to Sit Sit to Stand: 5: Supervision;With upper extremity assist;From chair/3-in-1 Stand to Sit: 5: Supervision;With upper extremity assist;To chair/3-in-1 Details for Transfer Assistance: verbal cues for safe standing up, use of UE's back precautions           End of Session OT - End of Session Activity Tolerance: Patient tolerated treatment well Patient left: in chair;with call bell/phone within reach  GO     [facility], [place] Kung 05/30/2013, 9:26 AM

## 2013-05-30 NOTE — Evaluation (Signed)
Physical Therapy Evaluation Patient Details Name: Gilbert Maxwell MRN: [id number] DOB: Sep 11, 1933 Today's Date: 05/30/2013 Time: 4098-1191 PT Time Calculation (min): 25 min  PT Assessment / Plan / Recommendation History of Present Illness  s/p decompression HNP L3-4, spinal stenosis on 05/29/13  Clinical Impression  Pt tolerated ambulation well. Pt has DME and 24/7 caregivers. No further PT indicated.    PT Assessment  Patent does not need any further PT services    Follow Up Recommendations  No PT follow up    Does the patient have the potential to tolerate intense rehabilitation      Barriers to Discharge        Equipment Recommendations  None recommended by PT    Recommendations for Other Services     Frequency      Precautions / Restrictions Precautions Precautions: Back Precaution Booklet Issued: Yes (comment) Restrictions Weight Bearing Restrictions: No   Pertinent Vitals/Pain Back is sore. Has had meds      Mobility  Bed Mobility Bed Mobility: Rolling Left;Left Sidelying to Sit Rolling Left: 5: Supervision;With rail Left Sidelying to Sit: HOB elevated Details for Bed Mobility Assistance: verbal cues on log rolling, technique to get to side and sitting using back precautions Transfers Transfers: Sit to Stand;Stand to Sit Sit to Stand: 4: Min guard Stand to Sit: 5: Supervision Details for Transfer Assistance: verbal cues for safe standing up, use of UE's back precautions Ambulation/Gait Ambulation/Gait Assistance: 5: Supervision Ambulation Distance (Feet): 300 Feet Assistive device: Rolling walker Ambulation/Gait Assistance Details: pt steady with RW, prefers use of it at present Gait Pattern: Step-through pattern Stairs: Yes Stairs Assistance: 4: Min guard Stairs Assistance Details (indicate cue type and reason): cues for safe sequence keeping in mind of R leg being `q  Stair Management Technique: One rail Right;Forwards Number of Stairs: 4     Exercises     PT Diagnosis:    PT Problem List:   PT Treatment Interventions:       PT Goals(Current goals can be found in the care plan section)    Visit Information  Last PT Received On: 05/30/13 Assistance Needed: +1 History of Present Illness: s/p decompression HNP L3-4, spinal stenosis on 05/29/13       Prior Functioning  Home Living Family/patient expects to be discharged to:: Private residence Living Arrangements: Spouse/significant other Available Help at Discharge: Family;Available 24 hours/day Type of Home: House Home Access: Stairs to enter Entrance Stairs-Rails: None Home Layout: Two level;Able to live on main level with bedroom/bathroom Home Equipment: Dan Humphreys - 2 wheels;Bedside commode;Shower seat Prior Function Level of Independence: Independent Communication Communication: No difficulties    Cognition  Cognition Arousal/Alertness: Awake/alert Behavior During Therapy: WFL for tasks assessed/performed Overall Cognitive Status: Within Functional Limits for tasks assessed    Extremity/Trunk Assessment Upper Extremity Assessment Upper Extremity Assessment: Defer to OT evaluation Lower Extremity Assessment Lower Extremity Assessment: Overall WFL for tasks assessed Cervical / Trunk Assessment Cervical / Trunk Assessment: Normal   Balance    End of Session PT - End of Session Equipment Utilized During Treatment: Gait belt Activity Tolerance: Patient tolerated treatment well Patient left: in chair;with call bell/phone within reach;with family/visitor present Nurse Communication: Mobility status  GP     Rada Hay 05/30/2013, 9:15 AM

## 2013-05-31 DIAGNOSIS — R338 Other retention of urine: Secondary | ICD-10-CM | POA: Diagnosis present

## 2013-05-31 MED ORDER — METHOCARBAMOL 500 MG PO TABS: 500.0000 mg | ORAL_TABLET | Freq: Four times a day (QID) | ORAL | Status: DC | PRN

## 2013-05-31 MED ORDER — TAMSULOSIN HCL 0.4 MG PO CAPS: 0.4000 mg | ORAL_CAPSULE | Freq: Every day | ORAL | Status: DC

## 2013-05-31 MED ORDER — OXYCODONE HCL 5 MG PO TABS: 5.0000 mg | ORAL_TABLET | ORAL | Status: DC | PRN

## 2013-05-31 NOTE — Discharge Summary (Signed)
Physician Discharge Summary  Patient ID: Gilbert Maxwell MRN: [id number] DOB/AGE: May 07, 1933 79 y.o.  Admit date: 05/29/2013 Discharge date: 05/31/2013  Admission Diagnoses:Spinal Stenosis, Lumbar.  Discharge Diagnoses:Lumbar Spinal Stenosis  Active Problems:   Spinal stenosis, lumbar region, with neurogenic claudication   Acute urinary retention   Discharged Condition: Improved.  Hospital Course: Only problem was Post-Op Urinary retention.  Consults: rehabilitation medicine  Significant Diagnostic Studies: XRAYS in OR.  Treatments: PT and Flomax.  Discharge Exam: Blood pressure 125/72, pulse 78, temperature 98.5 F (36.9 C), temperature source Oral, resp. rate 18, height 5\' 7"  (1.702 m), weight 79.379 kg (175 lb), SpO2 94.00%. Extremities: Homans sign is negative, no sign of DVT  Disposition:   Discharge Orders   Future Orders Complete By Expires   Call MD / Call 911  As directed    Comments:     If you experience chest pain or shortness of breath, CALL 911 and be transported to the hospital emergency room.  If you develope a fever above 101 F, pus (white drainage) or increased drainage or redness at the wound, or calf pain, call your surgeon's office.   Constipation Prevention  As directed    Comments:     Drink plenty of fluids.  Prune juice may be helpful.  You may use a stool softener, such as Colace (over the counter) 100 mg twice a day.  Use MiraLax (over the counter) for constipation as needed.   Diet general  As directed    Discharge instructions  As directed    Comments:     Change your dressing daily. Shower only, no tub bath. You may shower starting Friday. Before shower, remove dressing, apply saran wrap over the wound.  After shower, remove saran wrap, and apply new dressing.  Call if any temperatures greater than 101 or any wound complications: [phone] during the day and ask for Dr. Jeannetta Ellis nurse, Mackey Birchwood.   Driving restrictions  As directed     Comments:     No driving for 2 weeks   Increase activity slowly as tolerated  As directed    Lifting restrictions  As directed    Comments:     No lifting       Medication List         acetaminophen 500 MG tablet  Commonly known as:  TYLENOL  Take 500 mg by mouth every 6 (six) hours as needed for pain.     aspirin 81 MG tablet  Take 81 mg by mouth daily.     atorvastatin 20 MG tablet  Commonly known as:  LIPITOR  Take 20 mg by mouth every morning.     calcium carbonate 200 MG capsule  Take 1 capsule by mouth daily. Patient takes [employer] brand Calcium 1000mg  capsule daily     fish oil-omega-3 fatty acids 1000 MG capsule  Take 1 g by mouth daily.     hydrochlorothiazide 25 MG tablet  Commonly known as:  HYDRODIURIL  Take 25 mg by mouth every morning.     ibuprofen 200 MG tablet  Commonly known as:  ADVIL,MOTRIN  Take 200 mg by mouth every 6 (six) hours as needed for pain.     levothyroxine 112 MCG tablet  Commonly known as:  SYNTHROID, LEVOTHROID  Take 112 mcg by mouth daily before breakfast.     methocarbamol 500 MG tablet  Commonly known as:  ROBAXIN  Take 1 tablet (500 mg total) by mouth every 6 (six) hours  as needed.     multivitamin tablet  Take 1 tablet by mouth daily.     olmesartan 40 MG tablet  Commonly known as:  BENICAR  Take 40 mg by mouth every morning.     oxyCODONE 5 MG immediate release tablet  Commonly known as:  ROXICODONE  Take 1-3 tablets (5-15 mg total) by mouth every 4 (four) hours as needed for pain.     tamsulosin 0.4 MG Caps capsule  Commonly known as:  FLOMAX  Take 1 capsule (0.4 mg total) by mouth daily.           Follow-up Information   Follow up with Dacotah Cabello A, MD. Schedule an appointment as soon as possible for a visit in 2 weeks.   Specialty:  Orthopedic Surgery   Contact information:   [address] [place] [address] [phone]       Signed: Jacki Cones 05/31/2013, 7:25  AM

## 2013-05-31 NOTE — Progress Notes (Signed)
Subjective: 2 Days Post-Op Procedure(s) (LRB): COMPLETE DECOMPRESSION L3 - L4 MICRODISCECTOMY L3 - L4 1 LEVEL (N/A) Patient reports pain as 2 on 0-10 scale.He developed post-Op urinary retention but later recovered with Flomax. Doing so much better this morning.    Objective: Vital signs in last 24 hours: Temp:  [98.2 F (36.8 C)-99.4 F (37.4 C)] 98.5 F (36.9 C) (09/12 0605) Pulse Rate:  [73-98] 78 (09/12 0605) Resp:  [18-20] 18 (09/12 0605) BP: (101-129)/(53-72) 125/72 mmHg (09/12 0605) SpO2:  [93 %-96 %] 94 % (09/12 0605)  Intake/Output from previous day: 09/11 0701 - 09/12 0700 In: 840 [P.O.:840] Out: 3025 [Urine:3025] Intake/Output this shift:    No results found for this basename: HGB,  in the last 72 hours No results found for this basename: WBC, RBC, HCT, PLT,  in the last 72 hours No results found for this basename: NA, K, CL, CO2, BUN, CREATININE, GLUCOSE, CALCIUM,  in the last 72 hours No results found for this basename: LABPT, INR,  in the last 72 hours  Dorsiflexion/Plantar flexion intact  Assessment/Plan: 2 Days Post-Op Procedure(s) (LRB): COMPLETE DECOMPRESSION L3 - L4 MICRODISCECTOMY L3 - L4 1 LEVEL (N/A) Up with therapy.Will discharge today.   Willella Harding A 05/31/2013, 7:22 AM

## 2013-05-31 NOTE — Progress Notes (Signed)
CSW consulted for SNF placement. PN reviewed. Pt being d/c home today. CSW is available to assist with SNF placement if plan changes prior to d/c.  Cori Razor LCSW [phone]

## 2014-11-01 DIAGNOSIS — M549 Dorsalgia, unspecified: Secondary | ICD-10-CM

## 2014-11-01 DIAGNOSIS — G8929 Other chronic pain: Secondary | ICD-10-CM | POA: Insufficient documentation

## 2015-10-06 DIAGNOSIS — G2581 Restless legs syndrome: Secondary | ICD-10-CM | POA: Insufficient documentation

## 2016-05-06 DIAGNOSIS — E039 Hypothyroidism, unspecified: Secondary | ICD-10-CM | POA: Insufficient documentation

## 2017-08-08 DIAGNOSIS — N183 Chronic kidney disease, stage 3 unspecified: Secondary | ICD-10-CM | POA: Insufficient documentation

## 2017-09-28 DIAGNOSIS — M5136 Other intervertebral disc degeneration, lumbar region: Secondary | ICD-10-CM | POA: Insufficient documentation

## 2017-10-19 DIAGNOSIS — G90529 Complex regional pain syndrome I of unspecified lower limb: Secondary | ICD-10-CM | POA: Insufficient documentation

## 2018-01-22 ENCOUNTER — Emergency Department (HOSPITAL_COMMUNITY)
Admission: EM | Admit: 2018-01-22 | Discharge: 2018-01-22 | Disposition: A | Discharge status: Home / Self Care | Payer: Medicare Other | Attending: Emergency Medicine | Admitting: Emergency Medicine

## 2018-01-22 ENCOUNTER — Encounter (HOSPITAL_COMMUNITY): Payer: Self-pay

## 2018-01-22 ENCOUNTER — Other Ambulatory Visit: Payer: Self-pay

## 2018-01-22 ENCOUNTER — Emergency Department (HOSPITAL_COMMUNITY): Payer: Medicare Other

## 2018-01-22 DIAGNOSIS — M48061 Spinal stenosis, lumbar region without neurogenic claudication: Secondary | ICD-10-CM | POA: Diagnosis not present

## 2018-01-22 DIAGNOSIS — E039 Hypothyroidism, unspecified: Secondary | ICD-10-CM | POA: Diagnosis not present

## 2018-01-22 DIAGNOSIS — M545 Low back pain: Secondary | ICD-10-CM

## 2018-01-22 DIAGNOSIS — Z79899 Other long term (current) drug therapy: Secondary | ICD-10-CM | POA: Insufficient documentation

## 2018-01-22 DIAGNOSIS — Z7982 Long term (current) use of aspirin: Secondary | ICD-10-CM | POA: Diagnosis not present

## 2018-01-22 DIAGNOSIS — Z87891 Personal history of nicotine dependence: Secondary | ICD-10-CM | POA: Diagnosis not present

## 2018-01-22 DIAGNOSIS — Z8673 Personal history of transient ischemic attack (TIA), and cerebral infarction without residual deficits: Secondary | ICD-10-CM | POA: Insufficient documentation

## 2018-01-22 DIAGNOSIS — E785 Hyperlipidemia, unspecified: Secondary | ICD-10-CM | POA: Insufficient documentation

## 2018-01-22 LAB — I-STAT CHEM 8, ED
BUN: 28 mg/dL — AB (ref 6–20)
CHLORIDE: 105 mmol/L (ref 101–111)
Calcium, Ion: 1.26 mmol/L (ref 1.15–1.40)
Creatinine, Ser: 1.6 mg/dL — ABNORMAL HIGH (ref 0.61–1.24)
Glucose, Bld: 113 mg/dL — ABNORMAL HIGH (ref 65–99)
HCT: 44 % (ref 39.0–52.0)
Hemoglobin: 15 g/dL (ref 13.0–17.0)
POTASSIUM: 4.5 mmol/L (ref 3.5–5.1)
SODIUM: 140 mmol/L (ref 135–145)
TCO2: 25 mmol/L (ref 22–32)

## 2018-01-22 LAB — CBG MONITORING, ED: Glucose-Capillary: 110 mg/dL — ABNORMAL HIGH (ref 65–99)

## 2018-01-22 MED ORDER — ACETAMINOPHEN 500 MG PO TABS: 500.0000 mg | ORAL_TABLET | Freq: Four times a day (QID) | ORAL | 0 refills | Status: AC | PRN

## 2018-01-22 MED ORDER — OXYCODONE-ACETAMINOPHEN 5-325 MG PO TABS: 1.0000 | ORAL_TABLET | Freq: Once | ORAL | Status: DC

## 2018-01-22 MED ORDER — OXYCODONE-ACETAMINOPHEN 5-325 MG PO TABS
1.0000 | ORAL_TABLET | Freq: Once | ORAL | Status: AC
  Administered 2018-01-22: 1 via ORAL
  Filled 2018-01-22: qty 1

## 2018-01-22 MED ORDER — OXYCODONE HCL 5 MG PO TABS: 2.5000 mg | ORAL_TABLET | Freq: Four times a day (QID) | ORAL | 0 refills | Status: DC | PRN

## 2018-01-22 MED ORDER — ONDANSETRON HCL 4 MG PO TABS: 4.0000 mg | ORAL_TABLET | Freq: Four times a day (QID) | ORAL | 0 refills | Status: AC

## 2018-01-22 MED ORDER — ONDANSETRON 4 MG PO TBDP
4.0000 mg | ORAL_TABLET | Freq: Once | ORAL | Status: AC
  Administered 2018-01-22: 4 mg via ORAL
  Filled 2018-01-22: qty 1

## 2018-01-22 NOTE — Discharge Instructions (Signed)
Take Tylenol every 6 hours scheduled.  For breakthrough pain, take half a tablet (2.5mg ) of oxycodone.  Use ice and heat alternating 20 minutes on, 20 minutes off.  [facility]) we will call you later today to schedule appointment with Dr. Ethelene Hal or his PA.  Please return to emergency department if you develop any new or worsening symptoms including numbness of your groin area, loss of bowel or bladder control, complete numbness of your legs, or any other new concerning symptoms.  Do not drink alcohol, drive, operate machinery or participate in any other potentially dangerous activities while taking opiate pain medication as it may make you sleepy. Do not take this medication with any other sedating medications, either prescription or over-the-counter. If you were prescribed Percocet or Vicodin, do not take these with acetaminophen (Tylenol) as it is already contained within these medications and overdose of Tylenol is dangerous.   This medication is an opiate (or narcotic) pain medication and can be habit forming.  Use it as little as possible to achieve adequate pain control.  Do not use or use it with extreme caution if you have a history of opiate abuse or dependence. This medication is intended for your use only - do not give any to anyone else and keep it in a secure place where nobody else, especially children, have access to it. It will also cause or worsen constipation, so you may want to consider taking an over-the-counter stool softener while you are taking this medication.

## 2018-01-22 NOTE — ED Triage Notes (Signed)
Patient has a history of spinal stenosis, herniated disc. Patient states he hs been taking  Steroid injections, but does not help any more. Patient c/o bilateral lower back pain. Patient states he had pain that radiated down both legs. Patient states the pain has worsened in his knees recently and feels like his knees want to "buckle."

## 2018-01-22 NOTE — ED Provider Notes (Signed)
[facility]-EMERGENCY DEPT Provider Note   CSN: [id number] Arrival date & time: 01/22/18  4098     History   Chief Complaint Chief Complaint  Patient presents with  . Back Pain    HPI Gilbert Maxwell is a 84 y.o. male with history of spinal stenosis and complex regional pain syndrome who presents with low back pain and leg pain.  Patient reports he sees Dr. Ethelene Hal and received a spinal injection little over week ago and has had no relief.  Over the past few days he has had sudden brief increases of pain down bilateral legs.  He is felt like his knees are going to give out on him.  He is not taking any medication at home except gabapentin.  He denies any fever, chills, chest pain, shortness of breath, nausea, vomiting, urinary symptoms or retention, numbness, tingling, saddle anesthesia, bowel or bladder incontinence.  HPI  Past Medical History:  Diagnosis Date  . Abnormal cardiovascular stress test 6/11   No symptoms- medical Rx  . Anxiety   . DJD (degenerative joint disease)   . Dyslipidemia   . GERD (gastroesophageal reflux disease)    hx of   . HTN (hypertension)   . Hypothyroid   . Stroke [facility])    small " mini stroke" - 2002     Patient Active Problem List   Diagnosis Date Noted  . Acute urinary retention 05/31/2013  . Spinal stenosis, lumbar region, with neurogenic claudication 05/29/2013  . Preoperative cardiovascular examination 05/14/2013  . Hyperlipidemia 05/14/2013  . HTN (hypertension) 05/14/2013  . hyperuricemia 05/14/2013    Past Surgical History:  Procedure Laterality Date  . LUMBAR LAMINECTOMY/DECOMPRESSION MICRODISCECTOMY N/A 05/29/2013   Procedure: COMPLETE DECOMPRESSION L3 - L4 MICRODISCECTOMY L3 - L4 1 LEVEL;  Surgeon: Jacki Cones, MD;  Location: [facility] ORS;  Service: Orthopedics;  Laterality: N/A;  . POSTERIOR LAMINECTOMY / DECOMPRESSION LUMBAR SPINE  June 2011        Home Medications    Prior to Admission medications     Medication Sig Start Date End Date Taking? Authorizing Provider  aspirin 81 MG tablet Take 81 mg by mouth daily.   Yes [provider]  atorvastatin (LIPITOR) 10 MG tablet Take 10 mg by mouth daily. 11/09/17  Yes [provider]  fish oil-omega-3 fatty acids 1000 MG capsule Take 1 g by mouth daily.   Yes [provider]  gabapentin (NEURONTIN) 300 MG capsule Take 300 mg by mouth at bedtime as needed for pain. 01/16/18  Yes [provider]  hydrochlorothiazide (HYDRODIURIL) 25 MG tablet Take 25 mg by mouth every morning.    Yes [provider]  ibuprofen (ADVIL,MOTRIN) 200 MG tablet Take 200 mg by mouth every 6 (six) hours as needed for pain.   Yes [provider]  levothyroxine (SYNTHROID, LEVOTHROID) 125 MCG tablet Take 112 mcg by mouth daily before breakfast.   Yes [provider]  Multiple Vitamin (MULTIVITAMIN) tablet Take 1 tablet by mouth daily.   Yes [provider]  olmesartan (BENICAR) 40 MG tablet Take 40 mg by mouth every morning.    Yes [provider]  acetaminophen (TYLENOL) 500 MG tablet Take 1 tablet (500 mg total) by mouth every 6 (six) hours as needed. 01/22/18   Lazlo Tunney, Waylan Boga, PA-C  methocarbamol (ROBAXIN) 500 MG tablet Take 1 tablet (500 mg total) by mouth every 6 (six) hours as needed. Patient not taking: Reported on 01/22/2018 05/31/13   Celedonio Savage,  Amber, PA-C  ondansetron (ZOFRAN) 4 MG tablet Take 1 tablet (4 mg total) by mouth every 6 (six) hours. 01/22/18   Matei Magnone, Waylan Boga, PA-C  oxyCODONE (ROXICODONE) 5 MG immediate release tablet Take 0.5 tablets (2.5 mg total) by mouth every 6 (six) hours as needed for severe pain. 01/22/18   Lahoma Constantin, Waylan Boga, PA-C  tamsulosin (FLOMAX) 0.4 MG CAPS capsule Take 1 capsule (0.4 mg total) by mouth daily. Patient not taking: Reported on 01/22/2018 05/31/13   Dimitri Ped, PA-C    Family History Family History  Problem Relation Age of Onset  . Heart disease Father         died 63  . Cancer Mother        died 70    Social History Social History   Tobacco Use  . Smoking status: Former Games developer  . Smokeless tobacco: Never Used  Substance Use Topics  . Alcohol use: Yes    Comment: 1-2 daily  . Drug use: No     Allergies   Ace inhibitors; Beta adrenergic blockers; Cyclobenzaprine; and Indomethacin   Review of Systems Review of Systems  Constitutional: Negative for chills and fever.  HENT: Negative for facial swelling and sore throat.   Respiratory: Negative for shortness of breath.   Cardiovascular: Negative for chest pain.  Gastrointestinal: Negative for abdominal pain, nausea and vomiting.  Genitourinary: Negative for dysuria.  Musculoskeletal: Positive for arthralgias, back pain and myalgias.  Skin: Negative for rash and wound.  Neurological: Negative for numbness and headaches.  Psychiatric/Behavioral: The patient is not nervous/anxious.      Physical Exam Updated Vital Signs BP 120/75 (BP Location: Left Arm)   Pulse 70   Temp 97.9 F (36.6 C) (Oral)   Resp 20   Ht 5' 6.5" (1.689 m)   Wt 81.6 kg (180 lb)   SpO2 94%   BMI 28.62 kg/m   Physical Exam  Constitutional: He appears well-developed and well-nourished. No distress.  HENT:  Head: Normocephalic and atraumatic.  Mouth/Throat: Oropharynx is clear and moist. No oropharyngeal exudate.  Eyes: Pupils are equal, round, and reactive to light. Conjunctivae are normal. Right eye exhibits no discharge. Left eye exhibits no discharge. No scleral icterus.  Neck: Normal range of motion. Neck supple. No thyromegaly present.  Cardiovascular: Normal rate, regular rhythm, normal heart sounds and intact distal pulses. Exam reveals no gallop and no friction rub.  No murmur heard. Pulmonary/Chest: Effort normal and breath sounds normal. No stridor. No respiratory distress. He has no wheezes. He has no rales.  Abdominal: Soft. Bowel sounds are normal. He exhibits no distension. There is  no tenderness. There is no rebound and no guarding.  Musculoskeletal: He exhibits no edema.  Midline scar of the lumbar spine, no tenderness over the cervical, thoracic, or lumbar spine or paraspinal regions  Lymphadenopathy:    He has no cervical adenopathy.  Neurological: He is alert. Coordination normal.  5/5 strength to bilateral lower extremities, normal sensation; fasciculations in the muscles of the lower extremities  Skin: Skin is warm and dry. No rash noted. He is not diaphoretic. No pallor.  Psychiatric: He has a normal mood and affect.  Nursing note and vitals reviewed.    ED Treatments / Results  Labs (all labs ordered are listed, but only abnormal results are displayed) Labs Reviewed  CBG MONITORING, ED - Abnormal; Notable for the following components:      Result Value   Glucose-Capillary 110 (*)    All other components  within normal limits  I-STAT CHEM 8, ED - Abnormal; Notable for the following components:   BUN 28 (*)    Creatinine, Ser 1.60 (*)    Glucose, Bld 113 (*)    All other components within normal limits    EKG None  Radiology Dg Lumbar Spine Complete  Result Date: 01/22/2018 CLINICAL DATA:  History of spinal stenosis, now with low back pain, pain does radiate into the lower extremities EXAM: LUMBAR SPINE - COMPLETE 4+ VIEW COMPARISON:  Lumbar spine films of 05/22/2013 FINDINGS: There is slight curvature of the lower lumbar spine convex to the left by 14 degrees. There is a slight anterolisthesis of L4 on L5 by 8 mm most likely due to degenerative change of the facet joints. Also slight retrolisthesis of L3 on L4 and L2 on L3 is noted of 3 and 4 mm respectively. The SI joints appear corticated. There has been significant progression of degenerative disc disease at L2-3, L3-4, L4-5, and little change in degenerative disc disease at L5-S1. At these levels there is loss of disc space with sclerosis, spurring, and some vacuum disc phenomenon present. No  compression deformity is seen. Only mild degenerative change of facet joints of the lower lumbar spine is seen. Abdominal aortic calcification is noted with little change in the slight bulging of the distal abdominal aorta noted. IMPRESSION: 1. Significant progression of degenerative disc disease with degenerative disc disease now most prominently at L2-3, L3-4, and L4-5 levels with little change in degenerative disc disease at L5-S1. 2. Slight anterolisthesis of L4 on L5 with retrolisthesis of L2 on L3 and L3 on L4 most likely degenerative in origin. 3. Mild curvature of the lumbar spine convex to the left by 14 degrees. 4. Moderate abdominal aortic atherosclerosis. Electronically Signed   By: Dwyane Dee M.D.   On: 01/22/2018 10:18    Procedures Procedures (including critical care time)  Medications Ordered in ED Medications  oxyCODONE-acetaminophen (PERCOCET/ROXICET) 5-325 MG per tablet 1 tablet (1 tablet Oral Given 01/22/18 0955)  ondansetron (ZOFRAN-ODT) disintegrating tablet 4 mg (4 mg Oral Given 01/22/18 0956)     Initial Impression / Assessment and Plan / ED Course  I have reviewed the triage vital signs and the nursing notes.  Pertinent labs & imaging results that were available during my care of the patient were reviewed by me and considered in my medical decision making (see chart for details).     Patient with history of spinal stenosis and complex regional pain syndrome with worsening symptoms radiate into his legs.  X-ray today shows significant progression of DDD from 05/2013 most prominently at L2-3, L3-4, and L4-5 with little change at L5-S1; slight anterior listhesis of L4 on L5 with retrolisthesis of L2 on L3 and L3 on L4.  Patient is neurovascularly intact.  Patient improved after 1 Percocet in the ED.  I consulted patient's orthopedist and spoke to his PA who advised they can arrange close follow-up with him for outpatient MRI in the next few days.  Will discharge home with  scheduled Tylenol and advised to 2.5 milligrams oxycodone for breakthrough pain.  Will give Zofran to help with stomach upset related to pain medication.  I reviewed the [place] narcotic database and found no discrepancies.  Return precautions discussed.  Patient understands and agrees with plan.  Patient vitals stable throughout ED course and discharged in satisfactory condition.  Patient also evaluated by Dr. Erma Heritage who guided the patient's management and agrees with plan.  Final Clinical  Impressions(s) / ED Diagnoses   Final diagnoses:  Spinal stenosis of lumbar region, unspecified whether neurogenic claudication present  Acute bilateral low back pain, with sciatica presence unspecified    ED Discharge Orders        Ordered    acetaminophen (TYLENOL) 500 MG tablet  Every 6 hours PRN     01/22/18 1119    oxyCODONE (ROXICODONE) 5 MG immediate release tablet  Every 6 hours PRN     01/22/18 1119    ondansetron (ZOFRAN) 4 MG tablet  Every 6 hours     01/22/18 1120       Alejandro Gamel, Sidney, PA-C 01/22/18 1515    Shaune Pollack, MD 01/24/18 1317

## 2018-04-26 ENCOUNTER — Other Ambulatory Visit: Payer: Self-pay | Admitting: Neurological Surgery

## 2018-04-26 DIAGNOSIS — M4807 Spinal stenosis, lumbosacral region: Secondary | ICD-10-CM

## 2018-05-09 ENCOUNTER — Ambulatory Visit
Admission: RE | Admit: 2018-05-09 | Discharge: 2018-05-09 | Disposition: A | Discharge status: Home / Self Care | Payer: Medicare Other | Source: Ambulatory Visit | Attending: Neurological Surgery | Admitting: Neurological Surgery

## 2018-05-09 VITALS — BP 138/61 | HR 68

## 2018-05-09 DIAGNOSIS — M4807 Spinal stenosis, lumbosacral region: Secondary | ICD-10-CM

## 2018-05-09 DIAGNOSIS — M5136 Other intervertebral disc degeneration, lumbar region: Secondary | ICD-10-CM

## 2018-05-09 DIAGNOSIS — M48062 Spinal stenosis, lumbar region with neurogenic claudication: Secondary | ICD-10-CM

## 2018-05-09 MED ORDER — MEPERIDINE HCL 100 MG/ML IJ SOLN
75.0000 mg | Freq: Once | INTRAMUSCULAR | Status: AC
  Administered 2018-05-09: 75 mg via INTRAMUSCULAR

## 2018-05-09 MED ORDER — ONDANSETRON HCL 4 MG/2ML IJ SOLN: 4.0000 mg | Freq: Four times a day (QID) | INTRAMUSCULAR | Status: DC | PRN

## 2018-05-09 MED ORDER — DIAZEPAM 5 MG PO TABS
5.0000 mg | ORAL_TABLET | Freq: Once | ORAL | Status: AC
  Administered 2018-05-09: 5 mg via ORAL

## 2018-05-09 MED ORDER — ONDANSETRON HCL 4 MG/2ML IJ SOLN
4.0000 mg | Freq: Once | INTRAMUSCULAR | Status: AC
  Administered 2018-05-09: 4 mg via INTRAMUSCULAR

## 2018-05-09 MED ORDER — IOPAMIDOL (ISOVUE-M 200) INJECTION 41%
20.0000 mL | Freq: Once | INTRAMUSCULAR | Status: AC
  Administered 2018-05-09: 20 mL via INTRATHECAL

## 2018-05-09 NOTE — Discharge Instructions (Signed)

## 2018-05-22 ENCOUNTER — Other Ambulatory Visit: Payer: Self-pay | Admitting: Neurological Surgery

## 2018-05-25 NOTE — Pre-Procedure Instructions (Signed)
Novak Riehm Foree  05/25/2018      [id number] - [place], [place] - [address] [place] [address] Phone: [phone] Fax: [phone]  [facility] [phone] - [place], [place] - [address] AT [facility] [address] [facility] [address] [place] [address] Phone: [phone] Fax: [phone]    Your procedure is scheduled on Wednesday September 11th.  Report to [facility] Admitting at 1030 A.M.  Call this number if you have problems the morning of surgery:  [phone]   Remember:  Do not eat or drink after midnight.     Take these medicines the morning of surgery with A SIP OF WATER   Tylenol (if needed)  Synthroid  7 days prior to surgery STOP taking any Aspirin(unless otherwise instructed by your surgeon), Aleve, Naproxen, Ibuprofen, Motrin, Advil, Goody's, BC's, all herbal medications, fish oil, and all vitamins     Do not wear jewelry  Do not wear lotions, powders, or colognes, or deodorant.  Do not shave 48 hours prior to surgery.  Men may shave face and neck.  Do not bring valuables to the hospital.  [facility] is not responsible for any belongings or valuables.  Contacts, dentures or bridgework may not be worn into surgery.  Leave your suitcase in the car.  After surgery it may be brought to your room.  For patients admitted to the hospital, discharge time will be determined by your treatment team.  Patients discharged the day of surgery will not be allowed to drive home.    [facility]- Preparing For Surgery  Before surgery, you can play an important role. Because skin is not sterile, your skin needs to be as free of germs as possible. You can reduce the number of germs on your skin by washing with CHG (chlorahexidine gluconate) Soap before surgery.  CHG is an antiseptic cleaner which kills germs and bonds with the skin to continue killing germs even after  washing.    Oral Hygiene is also important to reduce your risk of infection.  Remember - BRUSH YOUR TEETH THE MORNING OF SURGERY WITH YOUR REGULAR TOOTHPASTE  Please do not use if you have an allergy to CHG or antibacterial soaps. If your skin becomes reddened/irritated stop using the CHG.  Do not shave (including legs and underarms) for at least 48 hours prior to first CHG shower. It is OK to shave your face.  Please follow these instructions carefully.   1. Shower the NIGHT BEFORE SURGERY and the MORNING OF SURGERY with CHG.   2. If you chose to wash your hair, wash your hair first as usual with your normal shampoo.  3. After you shampoo, rinse your hair and body thoroughly to remove the shampoo.  4. Use CHG as you would any other liquid soap. You can apply CHG directly to the skin and wash gently with a scrungie or a clean washcloth.   5. Apply the CHG Soap to your body ONLY FROM THE NECK DOWN.  Do not use on open wounds or open sores. Avoid contact with your eyes, ears, mouth and genitals (private parts). Wash Face and genitals (private parts)  with your normal soap.  6. Wash thoroughly, paying special attention to the area where your surgery will be performed.  7. Thoroughly rinse your body with warm water from the neck down.  8. DO NOT shower/wash with your normal soap after using and rinsing off the  CHG Soap.  9. Pat yourself dry with a CLEAN TOWEL.  10. Wear CLEAN PAJAMAS to bed the night before surgery, wear comfortable clothes the morning of surgery  11. Place CLEAN SHEETS on your bed the night of your first shower and DO NOT SLEEP WITH PETS.    Day of Surgery:  Do not apply any deodorants/lotions.  Please wear clean clothes to the hospital/surgery center.   Remember to brush your teeth WITH YOUR REGULAR TOOTHPASTE.    Please read over the following fact sheets that you were given. Coughing and Deep Breathing, MRSA Information and Surgical Site Infection  Prevention

## 2018-05-28 ENCOUNTER — Encounter (HOSPITAL_COMMUNITY)
Admission: RE | Admit: 2018-05-28 | Discharge: 2018-05-28 | Disposition: A | Discharge status: Home / Self Care | Payer: Medicare Other | Source: Ambulatory Visit | Attending: Neurological Surgery | Admitting: Neurological Surgery

## 2018-05-28 ENCOUNTER — Encounter (HOSPITAL_COMMUNITY): Payer: Self-pay

## 2018-05-28 ENCOUNTER — Other Ambulatory Visit: Payer: Self-pay

## 2018-05-28 DIAGNOSIS — E785 Hyperlipidemia, unspecified: Secondary | ICD-10-CM | POA: Diagnosis not present

## 2018-05-28 DIAGNOSIS — M5136 Other intervertebral disc degeneration, lumbar region: Secondary | ICD-10-CM | POA: Diagnosis not present

## 2018-05-28 DIAGNOSIS — Z8249 Family history of ischemic heart disease and other diseases of the circulatory system: Secondary | ICD-10-CM | POA: Diagnosis not present

## 2018-05-28 DIAGNOSIS — F329 Major depressive disorder, single episode, unspecified: Secondary | ICD-10-CM | POA: Diagnosis not present

## 2018-05-28 DIAGNOSIS — E039 Hypothyroidism, unspecified: Secondary | ICD-10-CM | POA: Diagnosis not present

## 2018-05-28 DIAGNOSIS — Z8673 Personal history of transient ischemic attack (TIA), and cerebral infarction without residual deficits: Secondary | ICD-10-CM | POA: Diagnosis not present

## 2018-05-28 DIAGNOSIS — I10 Essential (primary) hypertension: Secondary | ICD-10-CM | POA: Diagnosis not present

## 2018-05-28 DIAGNOSIS — F419 Anxiety disorder, unspecified: Secondary | ICD-10-CM | POA: Diagnosis not present

## 2018-05-28 DIAGNOSIS — M2578 Osteophyte, vertebrae: Secondary | ICD-10-CM | POA: Diagnosis not present

## 2018-05-28 DIAGNOSIS — K219 Gastro-esophageal reflux disease without esophagitis: Secondary | ICD-10-CM | POA: Diagnosis not present

## 2018-05-28 DIAGNOSIS — I700 Atherosclerosis of aorta: Secondary | ICD-10-CM | POA: Diagnosis not present

## 2018-05-28 DIAGNOSIS — M1990 Unspecified osteoarthritis, unspecified site: Secondary | ICD-10-CM | POA: Diagnosis not present

## 2018-05-28 DIAGNOSIS — Z7982 Long term (current) use of aspirin: Secondary | ICD-10-CM | POA: Diagnosis not present

## 2018-05-28 DIAGNOSIS — Z79899 Other long term (current) drug therapy: Secondary | ICD-10-CM | POA: Diagnosis not present

## 2018-05-28 DIAGNOSIS — Z888 Allergy status to other drugs, medicaments and biological substances status: Secondary | ICD-10-CM | POA: Diagnosis not present

## 2018-05-28 DIAGNOSIS — Z809 Family history of malignant neoplasm, unspecified: Secondary | ICD-10-CM | POA: Diagnosis not present

## 2018-05-28 DIAGNOSIS — M48061 Spinal stenosis, lumbar region without neurogenic claudication: Secondary | ICD-10-CM | POA: Diagnosis not present

## 2018-05-28 DIAGNOSIS — M5125 Other intervertebral disc displacement, thoracolumbar region: Secondary | ICD-10-CM | POA: Diagnosis not present

## 2018-05-28 DIAGNOSIS — Z87891 Personal history of nicotine dependence: Secondary | ICD-10-CM | POA: Diagnosis not present

## 2018-05-28 HISTORY — DX: Depression, unspecified: F32.A

## 2018-05-28 HISTORY — DX: Major depressive disorder, single episode, unspecified: F32.9

## 2018-05-28 LAB — BASIC METABOLIC PANEL
ANION GAP: 11 (ref 5–15)
BUN: 27 mg/dL — ABNORMAL HIGH (ref 8–23)
CHLORIDE: 107 mmol/L (ref 98–111)
CO2: 23 mmol/L (ref 22–32)
CREATININE: 1.73 mg/dL — AB (ref 0.61–1.24)
Calcium: 10.1 mg/dL (ref 8.9–10.3)
GFR calc non Af Amer: 35 mL/min — ABNORMAL LOW (ref >60)
GFR, EST AFRICAN AMERICAN: 40 mL/min — AB (ref >60)
Glucose, Bld: 100 mg/dL — ABNORMAL HIGH (ref 70–99)
POTASSIUM: 4.6 mmol/L (ref 3.5–5.1)
Sodium: 141 mmol/L (ref 135–145)

## 2018-05-28 LAB — CBC
HEMATOCRIT: 44.2 % (ref 39.0–52.0)
HEMOGLOBIN: 14.2 g/dL (ref 13.0–17.0)
MCH: 32.1 pg (ref 26.0–34.0)
MCHC: 32.1 g/dL (ref 30.0–36.0)
MCV: 100 fL (ref 78.0–100.0)
Platelets: 185 10*3/uL (ref 150–400)
RBC: 4.42 MIL/uL (ref 4.22–5.81)
RDW: 13.5 % (ref 11.5–15.5)
WBC: 11.3 10*3/uL — ABNORMAL HIGH (ref 4.0–10.5)

## 2018-05-28 LAB — SURGICAL PCR SCREEN
MRSA, PCR: NEGATIVE
Staphylococcus aureus: NEGATIVE

## 2018-05-29 NOTE — Progress Notes (Signed)
Anesthesia Chart Review:  Case:  [id number] Date/Time:  05/30/18 1215   Procedure:  Lumbar 2-3 Lumbar 3-4 Laminectomy/Foraminotomy (N/A Back) - Lumbar 2-3 Lumbar 3-4 Laminectomy/Foraminotomy   Anesthesia type:  General   Pre-op diagnosis:  Spinal stenosis of lumbosacral region   [facility]:  [facility] ROOM 18 / [facility] OR   Surgeon:  Tia Alert, MD      DISCUSSION: 84 yo male former smoker for above procedure. Pertinent hx includes HTN, Hypothyroid, Stroke ("mini stroke" 2002), Depression, Anxiety, GERD, CKDIII.  Pt followed by PCP for CKD, review of history shows baseline creatinine appears to be ~1.6.  Pt had an abnormal stress test in 2011 showing mild ischemia in the basal inferior and mid inferior region. EF 67%. No ECG changes. Notes at that time indicate he was active and asymptomatic and no further ischemic workup as recommended. He was cleared for lumbar decompression in 2011 as low risk.   He was last seen by Dr. Royann Shivers in 2014 prior to a 2nd back surgery. It was noted that he was doing well from cardiovascular standpoint, mowing his lawn with no SOB or CP. He was cleared for surgery as low risk without additional testing. Previously tried Bblocker but intolerant due to dizziness. At that time it does not appear any followup was recommended.  He has not had cardiac followup since 2014 but does not report any new symptoms. He was previously considered low risk for similar surgery in 2011 and 2014. EKG at PAT appt showed NSR without ischemic changes. Anticipate he can proceed with surgery as planned barring acute status change.  VS: BP (!) 149/80   Pulse 67   Temp 36.9 C (Oral)   Ht 5\' 7"  (1.702 m)   Wt 81.1 kg   SpO2 97%   BMI 28.02 kg/m   PROVIDERS: Cloward, Laban Emperor, MD is PCP   LABS: Creatinine c/w pt's CKD III, his baseline appears to be ~1.6 Creatinine, Ser (mg/dL)  Date Value  04/54/0981 1.73 (H)  01/22/2018 1.60 (H)  05/22/2013 1.49 (H)  03/09/2010 1.74 (H)    (all  labs ordered are listed, but only abnormal results are displayed)  Labs Reviewed  BASIC METABOLIC PANEL - Abnormal; Notable for the following components:      Result Value   Glucose, Bld 100 (*)    BUN 27 (*)    Creatinine, Ser 1.73 (*)    GFR calc non Af Amer 35 (*)    GFR calc Af Amer 40 (*)    All other components within normal limits  CBC - Abnormal; Notable for the following components:   WBC 11.3 (*)    All other components within normal limits  SURGICAL PCR SCREEN     IMAGES: N/A   EKG: 05/28/2018: Sinus bradycardia (58bpm) with Premature atrial complexes Minimal voltage criteria for LVH, may be normal variant  CV: Myocardial perfusion scan 03/02/2010: Impression: There is evidence of mild ischemia in the basal inferior and mid inferior regions.  The post stress ejection fraction is 67%.  Global left ventricular systolic function is normal.  No ECG changes.  EKG is negative for ischemia.  Compared to previous study, there is no ischemia present.  Abnormal myocardial perfusion study.  There is mild ischemia in the inferior wall new since the prior study.   TTE 06/13/2008: Interpretation summary: A complete two-dimensional transthoracic echocardiogram was performed.  Left ventricular systolic function is normal.  Ejection fraction = >55%.  The transmitral spectral Doppler flow  pattern suggestive of impaired LV relaxation.  There is trace mitral regurgitation.  Past Medical History:  Diagnosis Date  . Abnormal cardiovascular stress test 6/11   No symptoms- medical Rx  . Anxiety   . Depression   . DJD (degenerative joint disease)   . Dyslipidemia   . GERD (gastroesophageal reflux disease)    hx of   . HTN (hypertension)   . Hypothyroid   . Stroke [facility])    small " mini stroke" - 2002     Past Surgical History:  Procedure Laterality Date  . LUMBAR LAMINECTOMY/DECOMPRESSION MICRODISCECTOMY N/A 05/29/2013   Procedure: COMPLETE DECOMPRESSION L3 - L4 MICRODISCECTOMY L3 -  L4 1 LEVEL;  Surgeon: Jacki Cones, MD;  Location: [facility]S;  Service: Orthopedics;  Laterality: N/A;  . POSTERIOR LAMINECTOMY / DECOMPRESSION LUMBAR SPINE  June 2011    MEDICATIONS: . acetaminophen (TYLENOL) 325 MG tablet  . acetaminophen (TYLENOL) 500 MG tablet  . aspirin 81 MG tablet  . atorvastatin (LIPITOR) 10 MG tablet  . gabapentin (NEURONTIN) 300 MG capsule  . hydrochlorothiazide (HYDRODIURIL) 25 MG tablet  . levothyroxine (SYNTHROID, LEVOTHROID) 125 MCG tablet  . Multiple Vitamin (MULTIVITAMIN) tablet  . olmesartan (BENICAR) 40 MG tablet  . ondansetron (ZOFRAN) 4 MG tablet  . OVER THE COUNTER MEDICATION  . oxyCODONE (ROXICODONE) 5 MG immediate release tablet  . tamsulosin (FLOMAX) 0.4 MG CAPS capsule   No current facility-administered medications for this encounter.    [facility] Short Stay Center/Anesthesiology Phone [phone] 05/29/2018 9:20 AM

## 2018-05-30 ENCOUNTER — Ambulatory Visit (HOSPITAL_COMMUNITY): Payer: Medicare Other | Admitting: Certified Registered Nurse Anesthetist

## 2018-05-30 ENCOUNTER — Ambulatory Visit (HOSPITAL_COMMUNITY): Payer: Medicare Other

## 2018-05-30 ENCOUNTER — Encounter (HOSPITAL_COMMUNITY)
Admission: RE | Disposition: A | Discharge status: Home / Self Care | Payer: Self-pay | Source: Ambulatory Visit | Attending: Neurological Surgery

## 2018-05-30 ENCOUNTER — Encounter (HOSPITAL_COMMUNITY): Payer: Self-pay | Admitting: General Practice

## 2018-05-30 ENCOUNTER — Observation Stay (HOSPITAL_COMMUNITY)
Admission: RE | Admit: 2018-05-30 | Discharge: 2018-05-31 | Disposition: A | Discharge status: Home / Self Care | Payer: Medicare Other | Source: Ambulatory Visit | Attending: Neurological Surgery | Admitting: Neurological Surgery

## 2018-05-30 ENCOUNTER — Ambulatory Visit (HOSPITAL_COMMUNITY): Payer: Medicare Other | Admitting: Physician Assistant

## 2018-05-30 ENCOUNTER — Other Ambulatory Visit: Payer: Self-pay

## 2018-05-30 DIAGNOSIS — Z9889 Other specified postprocedural states: Secondary | ICD-10-CM

## 2018-05-30 DIAGNOSIS — Z79899 Other long term (current) drug therapy: Secondary | ICD-10-CM | POA: Insufficient documentation

## 2018-05-30 DIAGNOSIS — Z888 Allergy status to other drugs, medicaments and biological substances status: Secondary | ICD-10-CM | POA: Insufficient documentation

## 2018-05-30 DIAGNOSIS — F419 Anxiety disorder, unspecified: Secondary | ICD-10-CM | POA: Insufficient documentation

## 2018-05-30 DIAGNOSIS — Z809 Family history of malignant neoplasm, unspecified: Secondary | ICD-10-CM | POA: Insufficient documentation

## 2018-05-30 DIAGNOSIS — Z8673 Personal history of transient ischemic attack (TIA), and cerebral infarction without residual deficits: Secondary | ICD-10-CM | POA: Insufficient documentation

## 2018-05-30 DIAGNOSIS — F329 Major depressive disorder, single episode, unspecified: Secondary | ICD-10-CM | POA: Insufficient documentation

## 2018-05-30 DIAGNOSIS — M2578 Osteophyte, vertebrae: Secondary | ICD-10-CM | POA: Insufficient documentation

## 2018-05-30 DIAGNOSIS — M5136 Other intervertebral disc degeneration, lumbar region: Secondary | ICD-10-CM | POA: Insufficient documentation

## 2018-05-30 DIAGNOSIS — M5125 Other intervertebral disc displacement, thoracolumbar region: Secondary | ICD-10-CM | POA: Diagnosis not present

## 2018-05-30 DIAGNOSIS — M48061 Spinal stenosis, lumbar region without neurogenic claudication: Secondary | ICD-10-CM | POA: Diagnosis not present

## 2018-05-30 DIAGNOSIS — M1990 Unspecified osteoarthritis, unspecified site: Secondary | ICD-10-CM | POA: Insufficient documentation

## 2018-05-30 DIAGNOSIS — E039 Hypothyroidism, unspecified: Secondary | ICD-10-CM | POA: Insufficient documentation

## 2018-05-30 DIAGNOSIS — Z419 Encounter for procedure for purposes other than remedying health state, unspecified: Secondary | ICD-10-CM

## 2018-05-30 DIAGNOSIS — I10 Essential (primary) hypertension: Secondary | ICD-10-CM | POA: Insufficient documentation

## 2018-05-30 DIAGNOSIS — Z8249 Family history of ischemic heart disease and other diseases of the circulatory system: Secondary | ICD-10-CM | POA: Insufficient documentation

## 2018-05-30 DIAGNOSIS — K219 Gastro-esophageal reflux disease without esophagitis: Secondary | ICD-10-CM | POA: Insufficient documentation

## 2018-05-30 DIAGNOSIS — E785 Hyperlipidemia, unspecified: Secondary | ICD-10-CM | POA: Insufficient documentation

## 2018-05-30 DIAGNOSIS — Z7982 Long term (current) use of aspirin: Secondary | ICD-10-CM | POA: Insufficient documentation

## 2018-05-30 DIAGNOSIS — I700 Atherosclerosis of aorta: Secondary | ICD-10-CM | POA: Insufficient documentation

## 2018-05-30 DIAGNOSIS — Z87891 Personal history of nicotine dependence: Secondary | ICD-10-CM | POA: Insufficient documentation

## 2018-05-30 HISTORY — PX: LUMBAR LAMINECTOMY/DECOMPRESSION MICRODISCECTOMY: SHX5026

## 2018-05-30 SURGERY — LUMBAR LAMINECTOMY/DECOMPRESSION MICRODISCECTOMY 2 LEVELS
Anesthesia: General | Site: Back

## 2018-05-30 MED ORDER — 0.9 % SODIUM CHLORIDE (POUR BTL) OPTIME
TOPICAL | Status: DC | PRN
  Administered 2018-05-30: 1000 mL

## 2018-05-30 MED ORDER — PHENOL 1.4 % MT LIQD: 1.0000 | OROMUCOSAL | Status: DC | PRN

## 2018-05-30 MED ORDER — THROMBIN 5000 UNITS EX SOLR
CUTANEOUS | Status: AC
  Filled 2018-05-30: qty 5000

## 2018-05-30 MED ORDER — ACETAMINOPHEN 500 MG PO TABS
500.0000 mg | ORAL_TABLET | Freq: Four times a day (QID) | ORAL | Status: DC | PRN
  Administered 2018-05-30: 1000 mg via ORAL
  Filled 2018-05-30: qty 2

## 2018-05-30 MED ORDER — FENTANYL CITRATE (PF) 100 MCG/2ML IJ SOLN
INTRAMUSCULAR | Status: AC
  Administered 2018-05-30: 50 ug via INTRAVENOUS
  Filled 2018-05-30: qty 2

## 2018-05-30 MED ORDER — POTASSIUM CHLORIDE IN NACL 20-0.9 MEQ/L-% IV SOLN
INTRAVENOUS | Status: DC
  Administered 2018-05-30: 18:00:00 via INTRAVENOUS
  Filled 2018-05-30: qty 1000

## 2018-05-30 MED ORDER — SODIUM CHLORIDE 0.9 % IV SOLN: 250.0000 mL | INTRAVENOUS | Status: DC

## 2018-05-30 MED ORDER — FENTANYL CITRATE (PF) 100 MCG/2ML IJ SOLN
25.0000 ug | INTRAMUSCULAR | Status: DC | PRN
  Administered 2018-05-30 (×2): 50 ug via INTRAVENOUS

## 2018-05-30 MED ORDER — LEVOTHYROXINE SODIUM 75 MCG PO TABS
125.0000 ug | ORAL_TABLET | Freq: Every day | ORAL | Status: DC
  Administered 2018-05-31: 125 ug via ORAL
  Filled 2018-05-30: qty 1

## 2018-05-30 MED ORDER — ROCURONIUM BROMIDE 10 MG/ML (PF) SYRINGE
PREFILLED_SYRINGE | INTRAVENOUS | Status: DC | PRN
  Administered 2018-05-30: 50 mg via INTRAVENOUS
  Administered 2018-05-30: 20 mg via INTRAVENOUS

## 2018-05-30 MED ORDER — EPHEDRINE 5 MG/ML INJ
INTRAVENOUS | Status: AC
  Filled 2018-05-30: qty 10

## 2018-05-30 MED ORDER — SUGAMMADEX SODIUM 200 MG/2ML IV SOLN
INTRAVENOUS | Status: DC | PRN
  Administered 2018-05-30: 175 mg via INTRAVENOUS

## 2018-05-30 MED ORDER — CEFAZOLIN SODIUM-DEXTROSE 2-4 GM/100ML-% IV SOLN
2.0000 g | INTRAVENOUS | Status: AC
  Administered 2018-05-30: 2 g via INTRAVENOUS

## 2018-05-30 MED ORDER — ROCURONIUM BROMIDE 50 MG/5ML IV SOSY
PREFILLED_SYRINGE | INTRAVENOUS | Status: AC
  Filled 2018-05-30: qty 5

## 2018-05-30 MED ORDER — LACTATED RINGERS IV SOLN
INTRAVENOUS | Status: DC
  Administered 2018-05-30 (×2): via INTRAVENOUS

## 2018-05-30 MED ORDER — METHOCARBAMOL 1000 MG/10ML IJ SOLN
500.0000 mg | Freq: Four times a day (QID) | INTRAVENOUS | Status: DC | PRN
  Filled 2018-05-30: qty 5

## 2018-05-30 MED ORDER — ONDANSETRON HCL 4 MG/2ML IJ SOLN
INTRAMUSCULAR | Status: DC | PRN
  Administered 2018-05-30: 4 mg via INTRAVENOUS

## 2018-05-30 MED ORDER — CHLORHEXIDINE GLUCONATE CLOTH 2 % EX PADS: 6.0000 | MEDICATED_PAD | Freq: Once | CUTANEOUS | Status: DC

## 2018-05-30 MED ORDER — ONDANSETRON HCL 4 MG/2ML IJ SOLN
INTRAMUSCULAR | Status: AC
  Filled 2018-05-30: qty 2

## 2018-05-30 MED ORDER — SODIUM CHLORIDE 0.9 % IV SOLN
INTRAVENOUS | Status: DC | PRN
  Administered 2018-05-30: 500 mL

## 2018-05-30 MED ORDER — METHOCARBAMOL 500 MG PO TABS
ORAL_TABLET | ORAL | Status: AC
  Administered 2018-05-30: 500 mg via ORAL
  Filled 2018-05-30: qty 1

## 2018-05-30 MED ORDER — ACETAMINOPHEN 325 MG PO TABS: 650.0000 mg | ORAL_TABLET | ORAL | Status: DC | PRN

## 2018-05-30 MED ORDER — ONDANSETRON HCL 4 MG/2ML IJ SOLN: 4.0000 mg | Freq: Four times a day (QID) | INTRAMUSCULAR | Status: DC | PRN

## 2018-05-30 MED ORDER — SODIUM CHLORIDE 0.9% FLUSH: 3.0000 mL | Freq: Two times a day (BID) | INTRAVENOUS | Status: DC

## 2018-05-30 MED ORDER — IRBESARTAN 75 MG PO TABS
37.5000 mg | ORAL_TABLET | Freq: Every day | ORAL | Status: DC
  Administered 2018-05-31: 37.5 mg via ORAL
  Filled 2018-05-30 (×2): qty 0.5

## 2018-05-30 MED ORDER — HYDROCHLOROTHIAZIDE 25 MG PO TABS
25.0000 mg | ORAL_TABLET | Freq: Every morning | ORAL | Status: DC
  Administered 2018-05-30 – 2018-05-31 (×2): 25 mg via ORAL
  Filled 2018-05-30 (×2): qty 1

## 2018-05-30 MED ORDER — BUPIVACAINE HCL (PF) 0.25 % IJ SOLN
INTRAMUSCULAR | Status: DC | PRN
  Administered 2018-05-30: 8 mL
  Administered 2018-05-30: 10 mL

## 2018-05-30 MED ORDER — OXYCODONE HCL 5 MG/5ML PO SOLN: 5.0000 mg | Freq: Once | ORAL | Status: AC | PRN

## 2018-05-30 MED ORDER — TAMSULOSIN HCL 0.4 MG PO CAPS
0.4000 mg | ORAL_CAPSULE | Freq: Every day | ORAL | Status: DC
  Administered 2018-05-30 – 2018-05-31 (×2): 0.4 mg via ORAL
  Filled 2018-05-30 (×2): qty 1

## 2018-05-30 MED ORDER — ACETAMINOPHEN 650 MG RE SUPP: 650.0000 mg | RECTAL | Status: DC | PRN

## 2018-05-30 MED ORDER — ONDANSETRON HCL 4 MG PO TABS: 4.0000 mg | ORAL_TABLET | Freq: Four times a day (QID) | ORAL | Status: DC | PRN

## 2018-05-30 MED ORDER — EPHEDRINE SULFATE-NACL 50-0.9 MG/10ML-% IV SOSY
PREFILLED_SYRINGE | INTRAVENOUS | Status: DC | PRN
  Administered 2018-05-30 (×2): 10 mg via INTRAVENOUS

## 2018-05-30 MED ORDER — GABAPENTIN 300 MG PO CAPS
300.0000 mg | ORAL_CAPSULE | Freq: Every day | ORAL | Status: DC
  Administered 2018-05-30: 300 mg via ORAL
  Filled 2018-05-30: qty 1

## 2018-05-30 MED ORDER — OXYCODONE HCL 5 MG PO TABS
5.0000 mg | ORAL_TABLET | Freq: Once | ORAL | Status: AC | PRN
  Administered 2018-05-30: 5 mg via ORAL

## 2018-05-30 MED ORDER — LIDOCAINE 2% (20 MG/ML) 5 ML SYRINGE
INTRAMUSCULAR | Status: DC | PRN
  Administered 2018-05-30: 100 mg via INTRAVENOUS

## 2018-05-30 MED ORDER — FENTANYL CITRATE (PF) 250 MCG/5ML IJ SOLN
INTRAMUSCULAR | Status: AC
  Filled 2018-05-30: qty 5

## 2018-05-30 MED ORDER — HYDROMORPHONE HCL 1 MG/ML IJ SOLN
0.5000 mg | INTRAMUSCULAR | Status: DC | PRN
  Administered 2018-05-30: 0.5 mg via INTRAVENOUS
  Filled 2018-05-30: qty 0.5

## 2018-05-30 MED ORDER — CEFAZOLIN SODIUM-DEXTROSE 2-4 GM/100ML-% IV SOLN
2.0000 g | Freq: Three times a day (TID) | INTRAVENOUS | Status: AC
  Administered 2018-05-30 (×2): 2 g via INTRAVENOUS
  Filled 2018-05-30 (×2): qty 100

## 2018-05-30 MED ORDER — DEXAMETHASONE SODIUM PHOSPHATE 10 MG/ML IJ SOLN
INTRAMUSCULAR | Status: DC | PRN
  Administered 2018-05-30: 10 mg via INTRAVENOUS

## 2018-05-30 MED ORDER — MENTHOL 3 MG MT LOZG: 1.0000 | LOZENGE | OROMUCOSAL | Status: DC | PRN

## 2018-05-30 MED ORDER — OXYCODONE HCL 5 MG PO TABS
5.0000 mg | ORAL_TABLET | Freq: Four times a day (QID) | ORAL | Status: DC | PRN
  Administered 2018-05-30 – 2018-05-31 (×3): 5 mg via ORAL
  Filled 2018-05-30 (×3): qty 1

## 2018-05-30 MED ORDER — SODIUM CHLORIDE 0.9% FLUSH: 3.0000 mL | INTRAVENOUS | Status: DC | PRN

## 2018-05-30 MED ORDER — BUPIVACAINE HCL (PF) 0.25 % IJ SOLN
INTRAMUSCULAR | Status: AC
  Filled 2018-05-30: qty 30

## 2018-05-30 MED ORDER — PHENYLEPHRINE 40 MCG/ML (10ML) SYRINGE FOR IV PUSH (FOR BLOOD PRESSURE SUPPORT)
PREFILLED_SYRINGE | INTRAVENOUS | Status: DC | PRN
  Administered 2018-05-30: 80 ug via INTRAVENOUS

## 2018-05-30 MED ORDER — PROPOFOL 10 MG/ML IV BOLUS
INTRAVENOUS | Status: AC
  Filled 2018-05-30: qty 20

## 2018-05-30 MED ORDER — OXYCODONE HCL 5 MG PO TABS
ORAL_TABLET | ORAL | Status: AC
  Filled 2018-05-30: qty 1

## 2018-05-30 MED ORDER — FENTANYL CITRATE (PF) 250 MCG/5ML IJ SOLN
INTRAMUSCULAR | Status: DC | PRN
  Administered 2018-05-30 (×2): 50 ug via INTRAVENOUS
  Administered 2018-05-30: 100 ug via INTRAVENOUS

## 2018-05-30 MED ORDER — PHENYLEPHRINE 40 MCG/ML (10ML) SYRINGE FOR IV PUSH (FOR BLOOD PRESSURE SUPPORT)
PREFILLED_SYRINGE | INTRAVENOUS | Status: AC
  Filled 2018-05-30: qty 10

## 2018-05-30 MED ORDER — LIDOCAINE 2% (20 MG/ML) 5 ML SYRINGE
INTRAMUSCULAR | Status: AC
  Filled 2018-05-30: qty 5

## 2018-05-30 MED ORDER — SENNA 8.6 MG PO TABS
1.0000 | ORAL_TABLET | Freq: Two times a day (BID) | ORAL | Status: DC
  Administered 2018-05-30 – 2018-05-31 (×2): 8.6 mg via ORAL
  Filled 2018-05-30 (×2): qty 1

## 2018-05-30 MED ORDER — PROPOFOL 10 MG/ML IV BOLUS
INTRAVENOUS | Status: DC | PRN
  Administered 2018-05-30: 120 mg via INTRAVENOUS
  Administered 2018-05-30: 30 mg via INTRAVENOUS

## 2018-05-30 MED ORDER — METHOCARBAMOL 500 MG PO TABS
500.0000 mg | ORAL_TABLET | Freq: Four times a day (QID) | ORAL | Status: DC | PRN
  Administered 2018-05-30 – 2018-05-31 (×3): 500 mg via ORAL
  Filled 2018-05-30 (×2): qty 1

## 2018-05-30 MED ORDER — DEXAMETHASONE SODIUM PHOSPHATE 10 MG/ML IJ SOLN
INTRAMUSCULAR | Status: AC
  Filled 2018-05-30: qty 1

## 2018-05-30 MED ORDER — THROMBIN 5000 UNITS EX SOLR
OROMUCOSAL | Status: DC | PRN
  Administered 2018-05-30: 13:00:00

## 2018-05-30 MED ORDER — SODIUM CHLORIDE 0.9 % IV SOLN
INTRAVENOUS | Status: DC | PRN
  Administered 2018-05-30: 25 ug/min via INTRAVENOUS

## 2018-05-30 SURGICAL SUPPLY — 46 items
BAG DECANTER FOR FLEXI CONT (MISCELLANEOUS) ×3 IMPLANT
BENZOIN TINCTURE PRP APPL 2/3 (GAUZE/BANDAGES/DRESSINGS) ×3 IMPLANT
BUR MATCHSTICK NEURO 3.0 LAGG (BURR) ×3 IMPLANT
CANISTER SUCT 3000ML PPV (MISCELLANEOUS) ×3 IMPLANT
CARTRIDGE OIL MAESTRO DRILL (MISCELLANEOUS) ×1 IMPLANT
CLOSURE WOUND 1/2 X4 (GAUZE/BANDAGES/DRESSINGS) ×1
DERMABOND ADVANCED (GAUZE/BANDAGES/DRESSINGS) ×2
DERMABOND ADVANCED .7 DNX12 (GAUZE/BANDAGES/DRESSINGS) ×1 IMPLANT
DIFFUSER DRILL AIR PNEUMATIC (MISCELLANEOUS) ×3 IMPLANT
DRAPE LAPAROTOMY 100X72X124 (DRAPES) ×3 IMPLANT
DRAPE MICROSCOPE LEICA (MISCELLANEOUS) ×3 IMPLANT
DRAPE POUCH INSTRU U-SHP 10X18 (DRAPES) ×3 IMPLANT
DRAPE SURG 17X23 STRL (DRAPES) ×3 IMPLANT
DRSG OPSITE POSTOP 4X6 (GAUZE/BANDAGES/DRESSINGS) ×3 IMPLANT
DURAPREP 26ML APPLICATOR (WOUND CARE) ×3 IMPLANT
ELECT REM PT RETURN 9FT ADLT (ELECTROSURGICAL) ×3
ELECTRODE REM PT RTRN 9FT ADLT (ELECTROSURGICAL) ×1 IMPLANT
GAUZE 4X4 16PLY RFD (DISPOSABLE) IMPLANT
GLOVE BIO SURGEON STRL SZ7 (GLOVE) ×3 IMPLANT
GLOVE BIO SURGEON STRL SZ8 (GLOVE) ×3 IMPLANT
GLOVE BIOGEL PI IND STRL 7.0 (GLOVE) ×1 IMPLANT
GLOVE BIOGEL PI INDICATOR 7.0 (GLOVE) ×2
GOWN STRL REUS W/ TWL LRG LVL3 (GOWN DISPOSABLE) ×2 IMPLANT
GOWN STRL REUS W/ TWL XL LVL3 (GOWN DISPOSABLE) ×1 IMPLANT
GOWN STRL REUS W/TWL 2XL LVL3 (GOWN DISPOSABLE) IMPLANT
GOWN STRL REUS W/TWL LRG LVL3 (GOWN DISPOSABLE) ×4
GOWN STRL REUS W/TWL XL LVL3 (GOWN DISPOSABLE) ×2
HEMOSTAT POWDER KIT SURGIFOAM (HEMOSTASIS) ×3 IMPLANT
KIT BASIN OR (CUSTOM PROCEDURE TRAY) ×3 IMPLANT
KIT TURNOVER KIT B (KITS) ×3 IMPLANT
NEEDLE HYPO 25X1 1.5 SAFETY (NEEDLE) ×3 IMPLANT
NEEDLE SPNL 20GX3.5 QUINCKE YW (NEEDLE) ×3 IMPLANT
NS IRRIG 1000ML POUR BTL (IV SOLUTION) ×3 IMPLANT
OIL CARTRIDGE MAESTRO DRILL (MISCELLANEOUS) ×3
PACK LAMINECTOMY NEURO (CUSTOM PROCEDURE TRAY) ×3 IMPLANT
PAD ARMBOARD 7.5X6 YLW CONV (MISCELLANEOUS) ×9 IMPLANT
RUBBERBAND STERILE (MISCELLANEOUS) ×6 IMPLANT
SPONGE SURGIFOAM ABS GEL SZ50 (HEMOSTASIS) IMPLANT
STRIP CLOSURE SKIN 1/2X4 (GAUZE/BANDAGES/DRESSINGS) ×2 IMPLANT
SUT VIC AB 0 CT1 18XCR BRD8 (SUTURE) ×1 IMPLANT
SUT VIC AB 0 CT1 8-18 (SUTURE) ×2
SUT VIC AB 2-0 CP2 18 (SUTURE) ×3 IMPLANT
SUT VIC AB 3-0 SH 8-18 (SUTURE) ×6 IMPLANT
TOWEL GREEN STERILE (TOWEL DISPOSABLE) ×3 IMPLANT
TOWEL GREEN STERILE FF (TOWEL DISPOSABLE) ×3 IMPLANT
WATER STERILE IRR 1000ML POUR (IV SOLUTION) ×3 IMPLANT

## 2018-05-30 NOTE — Transfer of Care (Signed)
Immediate Anesthesia Transfer of Care Note  Patient: Gilbert Maxwell  Procedure(s) Performed: Lumbar Two-Three Lumbar Three-Four Laminectomy/Foraminotomy (N/A Back)  Patient Location: PACU  Anesthesia Type:General  Level of Consciousness: awake, alert , oriented and patient cooperative  Airway & Oxygen Therapy: Patient Spontanous Breathing and Patient connected to nasal cannula oxygen  Post-op Assessment: Report given to RN, Post -op Vital signs reviewed and stable and Patient moving all extremities X 4  Post vital signs: Reviewed and stable  Last Vitals:  Vitals Value Taken Time  BP 130/66 05/30/2018  2:36 PM  Temp    Pulse 80 05/30/2018  2:38 PM  Resp 16 05/30/2018  2:38 PM  SpO2 99 % 05/30/2018  2:38 PM  Vitals shown include unvalidated device data.  Last Pain:  Vitals:   05/30/18 1114  TempSrc:   PainSc: 0-No pain      Patients Stated Pain Goal: 1 (05/30/18 1114)  Complications: No apparent anesthesia complications

## 2018-05-30 NOTE — Op Note (Signed)
05/30/2018  2:30 PM  PATIENT:  Gilbert Maxwell  84 y.o. male  PRE-OPERATIVE DIAGNOSIS:  Severe lumbar spinal stenosis L2-3 L3-4  POST-OPERATIVE DIAGNOSIS:  same  PROCEDURE:  Decompressive lumbar laminectomy medial facetectomy and foraminotomy L2-3 L3-4 bilaterally  SURGEON:  Marikay Alar, MD  ASSISTANTS: Verlin Dike FNP  ANESTHESIA:   General  EBL: 150 ml  Total I/O In: -  Out: 150 [Blood:150]  BLOOD ADMINISTERED: none  DRAINS: none  SPECIMEN:  none  INDICATION FOR PROCEDURE: This patient presented with severe back and bilateral leg pain with weakness in the legs. Imaging showed severe spinal stenosis with complete myelographic block L2-3 L3-4. The patient tried conservative measures without relief. Pain was debilitating. Recommended the compressive laminectomy L2-3 L3-4. Patient understood the risks, benefits, and alternatives and potential outcomes and wished to proceed.  PROCEDURE DETAILS: The patient was taken to the operating room and after induction of adequate generalized endotracheal anesthesia, the patient was rolled into the prone position on the Wilson frame and all pressure points were padded. The lumbar region was cleaned and then prepped with DuraPrep and draped in the usual sterile fashion. 5 cc of local anesthesia was injected and then a dorsal midline incision was made and carried down to the lumbo sacral fascia. The fascia was opened and the paraspinous musculature was taken down in a subperiosteal fashion to expose L2-3 and L3-4.  Intraoperative x-ray confirmed my level, and then I used a Leksell rongeur to remove the remaining spinous process of L2 and the scar tissue from the superior surface of the lamina of L3. I then used acombination of the high-speed drill and the Kerrison punches to perform a laminectomy, medial facetectomy, and foraminotomy at L2-3 and L3-4 bilaterally. He had very severe spinal stenosis at both levels with severe compression of the  thecal sac especially at the pedicle level of L3. The underlying yellow ligament was opened and removed in a piecemeal fashion to expose the underlying dura and exiting nerve rootat both levels bilaterally. I undercut the lateral recess and dissected down until I was medial to and distal to the pedicle of L2, L3 and L4. The nerve root was well decompressed at each level bilaterally. I then palpated with a coronary dilator along the nerve root and into the foramen to assure adequate decompression. I felt no more compression of the nerve root. I irrigated with saline solution containing bacitracin. Achieved hemostasis with bipolar cautery,  and then closed the fascia with 0 Vicryl. I closed the subcutaneous tissues with 2-0 Vicryl and the subcuticular tissues with 3-0 Vicryl. The skin was then closed with benzoin and Steri-Strips. The drapes were removed, a sterile dressing was applied. The patient was awakened from general anesthesia and transferred to the recovery room in stable condition. At the end of the procedure all sponge, needle and instrument counts were correct.    PLAN OF CARE: Admit for overnight observation  PATIENT DISPOSITION:  PACU - hemodynamically stable.   Delay start of Pharmacological VTE agent (>24hrs) due to surgical blood loss or risk of bleeding:  yes

## 2018-05-30 NOTE — Anesthesia Preprocedure Evaluation (Signed)
Anesthesia Evaluation  Patient identified by MRN, date of birth, ID band Patient awake    Reviewed: Allergy & Precautions, H&P , NPO status , Patient's Chart, lab work & pertinent test results  Airway Mallampati: II  TM Distance: >3 FB Neck ROM: full    Dental  (+) Edentulous Upper, Partial Lower   Pulmonary neg shortness of breath, neg COPD, neg recent URI, former smoker,    breath sounds clear to auscultation       Cardiovascular hypertension, Pt. on medications (-) angina(-) Past MI and (-) CHF (-) dysrhythmias  Rhythm:regular Rate:Normal  abnormal stress test - medical treatment 2011.  Cardiology cleared him for this surgery,.   Neuro/Psych PSYCHIATRIC DISORDERS Anxiety Depression Mimi - stroke 2002  Neuromuscular disease CVA, No Residual Symptoms    GI/Hepatic Neg liver ROS, GERD  Medicated and Controlled,  Endo/Other  Hypothyroidism   Renal/GU Renal InsufficiencyRenal disease     Musculoskeletal  (+) Arthritis ,   Abdominal   Peds  Hematology negative hematology ROS (+)   Anesthesia Other Findings   Reproductive/Obstetrics                            Anesthesia Physical Anesthesia Plan  ASA: III  Anesthesia Plan: General   Post-op Pain Management:    Induction: Intravenous  PONV Risk Score and Plan: 2 and Ondansetron and Dexamethasone  Airway Management Planned: Oral ETT  Additional Equipment: None  Intra-op Plan:   Post-operative Plan: Extubation in OR  Informed Consent: I have reviewed the patients History and Physical, chart, labs and discussed the procedure including the risks, benefits and alternatives for the proposed anesthesia with the patient or authorized representative who has indicated his/her understanding and acceptance.   Dental advisory given  Plan Discussed with: CRNA and Surgeon  Anesthesia Plan Comments:         Anesthesia Quick Evaluation

## 2018-05-30 NOTE — H&P (Signed)
Subjective: Patient is a 84 y.o. male admitted for stenosis. Onset of symptoms was several months ago, gradually worsening since that time.  The pain is rated severe, and is located at the across the lower back and radiates to legs. The pain is described as aching and occurs all day. The symptoms have been progressive. Symptoms are exacerbated by exercise. MRI or CT showed severe stenosis L2-3 l3-4   Past Medical History:  Diagnosis Date  . Abnormal cardiovascular stress test 6/11   No symptoms- medical Rx  . Anxiety   . Depression   . DJD (degenerative joint disease)   . Dyslipidemia   . GERD (gastroesophageal reflux disease)    hx of   . HTN (hypertension)   . Hypothyroid   . Stroke [facility])    small " mini stroke" - 2002     Past Surgical History:  Procedure Laterality Date  . LUMBAR LAMINECTOMY/DECOMPRESSION MICRODISCECTOMY N/A 05/29/2013   Procedure: COMPLETE DECOMPRESSION L3 - L4 MICRODISCECTOMY L3 - L4 1 LEVEL;  Surgeon: Jacki Cones, MD;  Location: [facility]S;  Service: Orthopedics;  Laterality: N/A;  . POSTERIOR LAMINECTOMY / DECOMPRESSION LUMBAR SPINE  June 2011    Prior to Admission medications   Medication Sig Start Date End Date Taking? Authorizing Provider  acetaminophen (TYLENOL) 325 MG tablet Take 650 mg by mouth at bedtime as needed for moderate pain.   Yes [provider]  aspirin 81 MG tablet Take 81 mg by mouth daily.   Yes [provider]  atorvastatin (LIPITOR) 10 MG tablet Take 10 mg by mouth daily with lunch.  11/09/17  Yes [provider]  gabapentin (NEURONTIN) 300 MG capsule Take 300 mg by mouth at bedtime.  01/16/18  Yes [provider]  hydrochlorothiazide (HYDRODIURIL) 25 MG tablet Take 25 mg by mouth every morning.    Yes [provider]  levothyroxine (SYNTHROID, LEVOTHROID) 125 MCG tablet Take 125 mcg by mouth daily before breakfast.    Yes [provider]  Multiple Vitamin (MULTIVITAMIN) tablet Take 1  tablet by mouth daily.   Yes [provider]  olmesartan (BENICAR) 40 MG tablet Take 40 mg by mouth every morning.    Yes [provider]  OVER THE COUNTER MEDICATION Apply 1 application topically daily as needed (muscle pain/cramps). Theraworx otc muscle rub   Yes [provider]  oxyCODONE (ROXICODONE) 5 MG immediate release tablet Take 0.5 tablets (2.5 mg total) by mouth every 6 (six) hours as needed for severe pain. Patient taking differently: Take 5 mg by mouth at bedtime.  01/22/18  Yes Law, Waylan Boga, PA-C  acetaminophen (TYLENOL) 500 MG tablet Take 1 tablet (500 mg total) by mouth every 6 (six) hours as needed. Patient not taking: Reported on 05/24/2018 01/22/18   Emi Holes, PA-C  ondansetron (ZOFRAN) 4 MG tablet Take 1 tablet (4 mg total) by mouth every 6 (six) hours. Patient not taking: Reported on 04/26/2018 01/22/18   Emi Holes, PA-C  tamsulosin (FLOMAX) 0.4 MG CAPS capsule Take 1 capsule (0.4 mg total) by mouth daily. Patient not taking: Reported on 01/22/2018 05/31/13   Dimitri Ped, PA-C   Allergies  Allergen Reactions  . Indomethacin Nausea And Vomiting  . Ace Inhibitors Nausea Only  . Beta Adrenergic Blockers Nausea Only    dizzy    Social History   Tobacco Use  . Smoking status: Former Games developer  . Smokeless tobacco: Never Used  . Tobacco comment: quit about 25 yrs. ago  Substance Use Topics  . Alcohol use: Yes    Comment: 1-2 daily    Family History  Problem Relation Age of Onset  . Heart disease Father        died 101  . Cancer Mother        died 53     Review of Systems  Positive ROS: neg  All other systems have been reviewed and were otherwise negative with the exception of those mentioned in the HPI and as above.  Objective: Vital signs in last 24 hours: Temp:  [98.8 F (37.1 C)] 98.8 F (37.1 C) (09/11 1032) Pulse Rate:  [74] 74 (09/11 1032) Resp:  [18] 18 (09/11 1032) BP: (155)/(70) 155/70 (09/11 1032) SpO2:   [96 %] 96 % (09/11 1032) Weight:  [81.1 kg] 81.1 kg (09/11 1032)  General Appearance: Alert, cooperative, no distress, appears stated age Head: Normocephalic, without obvious abnormality, atraumatic Eyes: PERRL, conjunctiva/corneas clear, EOM's intact    Neck: Supple, symmetrical, trachea midline Back: Symmetric, no curvature, ROM normal, no CVA tenderness Lungs:  respirations unlabored Heart: Regular rate and rhythm Abdomen: Soft, non-tender Extremities: Extremities normal, atraumatic, no cyanosis or edema Pulses: 2+ and symmetric all extremities Skin: Skin color, texture, turgor normal, no rashes or lesions  NEUROLOGIC:   Mental status: Alert and oriented x4,  no aphasia, good attention span, fund of knowledge, and memory Motor Exam - grossly normal Sensory Exam - grossly normal Reflexes: trace Coordination - grossly normal Gait - grossly normal Balance - grossly normal Cranial Nerves: I: smell Not tested  II: visual acuity  OS: nl    OD: nl  II: visual fields Full to confrontation  II: pupils Equal, round, reactive to light  III,VII: ptosis None  III,IV,VI: extraocular muscles  Full ROM  V: mastication Normal  V: facial light touch sensation  Normal  V,VII: corneal reflex  Present  VII: facial muscle function - upper  Normal  VII: facial muscle function - lower Normal  VIII: hearing Not tested  IX: soft palate elevation  Normal  IX,X: gag reflex Present  XI: trapezius strength  5/5  XI: sternocleidomastoid strength 5/5  XI: neck flexion strength  5/5  XII: tongue strength  Normal    Data Review Lab Results  Component Value Date   WBC 11.3 (H) 05/28/2018   HGB 14.2 05/28/2018   HCT 44.2 05/28/2018   MCV 100.0 05/28/2018   PLT 185 05/28/2018   Lab Results  Component Value Date   NA 141 05/28/2018   K 4.6 05/28/2018   CL 107 05/28/2018   CO2 23 05/28/2018   BUN 27 (H) 05/28/2018   CREATININE 1.73 (H) 05/28/2018   GLUCOSE 100 (H) 05/28/2018   Lab Results   Component Value Date   INR 0.96 05/22/2013    Assessment/Plan:  Estimated body mass index is 28.02 kg/m as calculated from the following:   Height as of this encounter: 5\' 7"  (1.702 m).   Weight as of this encounter: 81.1 kg. Patient admitted for LL L2-3 L3-4. Patient has failed a reasonable attempt at conservative therapy.  I explained the condition and procedure to the patient and answered any questions.  Patient wishes to proceed with procedure as planned. Understands risks/ benefits and typical outcomes of procedure.   Gilbert Maxwell S 05/30/2018 12:13 PM

## 2018-05-30 NOTE — Anesthesia Procedure Notes (Signed)
Procedure Name: Intubation Date/Time: 05/30/2018 12:49 PM Performed by: Colin Benton, CRNA Pre-anesthesia Checklist: Emergency Drugs available, Suction available, Patient being monitored and Patient identified Patient Re-evaluated:Patient Re-evaluated prior to induction Oxygen Delivery Method: Circle system utilized Preoxygenation: Pre-oxygenation with 100% oxygen Induction Type: IV induction Ventilation: Mask ventilation without difficulty and Oral airway inserted - appropriate to patient size Laryngoscope Size: Mac and 4 Grade View: Grade I Tube type: Oral Tube size: 7.5 mm Number of attempts: 1 Airway Equipment and Method: Stylet Placement Confirmation: ETT inserted through vocal cords under direct vision,  positive ETCO2 and breath sounds checked- equal and bilateral Secured at: 22 cm Tube secured with: Tape Dental Injury: Teeth and Oropharynx as per pre-operative assessment

## 2018-05-31 ENCOUNTER — Encounter (HOSPITAL_COMMUNITY): Payer: Self-pay | Admitting: Neurological Surgery

## 2018-05-31 DIAGNOSIS — M48061 Spinal stenosis, lumbar region without neurogenic claudication: Secondary | ICD-10-CM | POA: Diagnosis not present

## 2018-05-31 MED ORDER — OXYCODONE HCL 5 MG PO TABS: 2.5000 mg | ORAL_TABLET | Freq: Four times a day (QID) | ORAL | 0 refills | Status: AC | PRN

## 2018-05-31 NOTE — Discharge Instructions (Signed)
Wound Care °Leave incision open to air. °You may shower. °Do not scrub directly on incision.  °Do not put any creams, lotions, or ointments on incision. °Activity °Walk each and every day, increasing distance each day. °No lifting greater than 5 lbs.  Avoid excessive neck motion. °No driving for 2 weeks; may ride as a passenger locally. °Diet °Resume your normal diet.  °Return to Work °Will be discussed at you follow up appointment. °Call Your Doctor If Any of These Occur °Redness, drainage, or swelling at the wound.  °Temperature greater than 101 degrees. °Severe pain not relieved by pain medication. °Increased difficulty swallowing. °Incision starts to come apart. °Follow Up Appt °Call today for appointment in 1-2 weeks ([phone]) or for problems.  If you have any hardware placed in your spine, you will need an x-ray before your appointment. °

## 2018-05-31 NOTE — Evaluation (Signed)
Occupational Therapy Evaluation and Discharge Patient Details Name: Gilbert Maxwell MRN: [id number] DOB: Sep 19, 1933 Today's Date: 05/31/2018    History of Present Illness Patient is a 84 y.o. male admitted for stenosis. MRI or CT showed severe stenosis L2-3 L3-4. Pt is s/p Decompressive lumbar laminectomy medial facetectomy and foraminotomy L2-3 L3-4 bilaterally   Clinical Impression   PTA Pt independent in ADL and mobility (limited in mobility by pain). Pt is currently supervision for ADL and min guard for LB - Pt is able to bring foot cross to knees to don socks and underwear. Back handout provided and reviewed adls in detail. Pt educated on: set an alarm at night for medication, avoid sitting for long periods of time, correct bed positioning for sleeping, correct sequence for bed mobility, avoiding lifting more than 5 pounds and never wash directly over incision. All education is complete and patient/wife indicate understanding and had no further questions for OT. Thank you for the opportunity to serve this patient.       Follow Up Recommendations  No OT follow up;Supervision - Intermittent    Equipment Recommendations  None recommended by OT    Recommendations for Other Services       Precautions / Restrictions Precautions Precautions: Back Precaution Booklet Issued: Yes (comment) Precaution Comments: no brace needed per MD order Restrictions Weight Bearing Restrictions: No      Mobility Bed Mobility               General bed mobility comments: received sitting EOB  Transfers Overall transfer level: Modified independent Equipment used: None                  Balance Overall balance assessment: Mild deficits observed, not formally tested                                         ADL either performed or assessed with clinical judgement   ADL Overall ADL's : Needs assistance/impaired Eating/Feeding: Independent   Grooming:  Supervision/safety;Standing;Cueing for compensatory techniques;Oral care;Wash/dry face Grooming Details (indicate cue type and reason): educated in cup method, setting up on the right, and other compensatory strategies for standing ADL Upper Body Bathing: Min guard   Lower Body Bathing: Min guard;Sitting/lateral leans Lower Body Bathing Details (indicate cue type and reason): able to cross feet to knees Upper Body Dressing : Supervision/safety   Lower Body Dressing: Min guard Lower Body Dressing Details (indicate cue type and reason): able to cross feet to knees to don/doff socks and underwear this session Toilet Transfer: Supervision/safety;Ambulation   Toileting- Clothing Manipulation and Hygiene: Supervision/safety;Sit to/from stand       Functional mobility during ADLs: Supervision/safety       Vision Patient Visual Report: No change from baseline       Perception     Praxis      Pertinent Vitals/Pain Pain Assessment: Faces Faces Pain Scale: Hurts little more Pain Location: incision site Pain Descriptors / Indicators: Discomfort;Operative site guarding;Sore Pain Intervention(s): Monitored during session;Repositioned     Hand Dominance Right   Extremity/Trunk Assessment Upper Extremity Assessment Upper Extremity Assessment: Overall WFL for tasks assessed   Lower Extremity Assessment Lower Extremity Assessment: Defer to PT evaluation   Cervical / Trunk Assessment Cervical / Trunk Assessment: Other exceptions Cervical / Trunk Exceptions: s/p lumbar sx   Communication Communication Communication: No difficulties   Cognition Arousal/Alertness: Awake/alert Behavior  During Therapy: WFL for tasks assessed/performed Overall Cognitive Status: Within Functional Limits for tasks assessed                                     General Comments  wife and son in room for session    Exercises     Shoulder Instructions      Home Living Family/patient  expects to be discharged to:: Private residence Living Arrangements: Spouse/significant other Available Help at Discharge: Family;Available 24 hours/day Type of Home: House Home Access: Stairs to enter   Entrance Stairs-Rails: None Home Layout: Two level;Able to live on main level with bedroom/bathroom Alternate Level Stairs-Number of Steps: flight Alternate Level Stairs-Rails: Left;Right;Can reach both Bathroom Shower/Tub: Tub/shower unit;Curtain   Bathroom Toilet: Handicapped height     Home Equipment: Environmental consultantWalker - 2 wheels;Bedside commode;Shower seat          Prior Functioning/Environment Level of Independence: Independent        Comments: has been limited in the amount of walking he can do and was not able to ride the care for long periods of time        OT Problem List:        OT Treatment/Interventions:      OT Goals(Current goals can be found in the care plan section) Acute Rehab OT Goals Patient Stated Goal: "to get back to walking daily with my wife" OT Goal Formulation: With patient Time For Goal Achievement: 06/14/18 Potential to Achieve Goals: Good  OT Frequency:     Barriers to D/C:            Co-evaluation              AM-PAC PT "6 Clicks" Daily Activity     Outcome Measure Help from another person eating meals?: None Help from another person taking care of personal grooming?: None Help from another person toileting, which includes using toliet, bedpan, or urinal?: None Help from another person bathing (including washing, rinsing, drying)?: A Little Help from another person to put on and taking off regular upper body clothing?: None Help from another person to put on and taking off regular lower body clothing?: None 6 Click Score: 23   End of Session Equipment Utilized During Treatment: Gait belt Nurse Communication: Mobility status;Other (comment)(Pt wants his bandage checked at incision site)  Activity Tolerance: Patient tolerated  treatment well Patient left: Other (comment);with call bell/phone within reach;with family/visitor present(EOB)                   Time: [id number] OT Time Calculation (min): 15 min Charges:  OT General Charges $OT Visit: 1 Visit OT Evaluation $OT Eval Low Complexity: 1 Low  Gilbert Maxwell Acute Rehabilitation Services Pager: [phone] Office: [phone]  Gilbert Maxwell 05/31/2018, 10:07 AM

## 2018-05-31 NOTE — Progress Notes (Signed)
Patient is discharged from room 3C09 at this time. Alert and in stable condition. IV site d/c'd and instructions read to patient and family with understanding verbalized. Left unit via wheelchair with all belongings at side. 

## 2018-05-31 NOTE — Evaluation (Signed)
Physical Therapy Evaluation Patient Details Name: Gilbert BruntCharles M Maxwell MRN: [id number] DOB: 1933-03-19 Today's Date: 05/31/2018   History of Present Illness  Patient is a 84 y.o. male admitted for stenosis. MRI or CT showed severe stenosis L2-3 L3-4. Pt is s/p Decompressive lumbar laminectomy medial facetectomy and foraminotomy L2-3 L3-4 bilaterally  Clinical Impression  Patient evaluated by Physical Therapy with no further acute PT needs identified. Patient demonstrating good activity tolerance and walking hallway distances with no assistive device without difficulty. Able to negotiate 10 steps with use of railing to prepare for discharge home. Instructed patient on generalized walking program and activity progressions. All education has been completed and the patient has no further questions. No follow-up Physical Therapy or equipment needs. PT is signing off. Thank you for this referral.     Follow Up Recommendations No PT follow up    Equipment Recommendations  None recommended by PT    Recommendations for Other Services       Precautions / Restrictions Precautions Precautions: Back Precaution Booklet Issued: Yes (comment) Precaution Comments: no brace needed per MD order Restrictions Weight Bearing Restrictions: No      Mobility  Bed Mobility Overal bed mobility: Modified Independent             General bed mobility comments: instructed on log roll technique  Transfers Overall transfer level: Modified independent Equipment used: None                Ambulation/Gait Ambulation/Gait assistance: Modified independent (Device/Increase time) Gait Distance (Feet): 400 Feet Assistive device: None Gait Pattern/deviations: WFL(Within Functional Limits)     General Gait Details: bilateral external foot rotation which is baseline  Stairs Stairs: Yes Stairs assistance: Modified independent (Device/Increase time) Stair Management: One rail Right Number of Stairs:  10 General stair comments: step by step pattern  Wheelchair Mobility    Modified Rankin (Stroke Patients Only)       Balance Overall balance assessment: No apparent balance deficits (not formally assessed)                                           Pertinent Vitals/Pain Pain Assessment: Faces Faces Pain Scale: Hurts little more Pain Location: incision site Pain Descriptors / Indicators: Discomfort;Operative site guarding;Sore Pain Intervention(s): Monitored during session    Home Living Family/patient expects to be discharged to:: Private residence Living Arrangements: Spouse/significant other Available Help at Discharge: Family;Available 24 hours/day Type of Home: House Home Access: Stairs to enter Entrance Stairs-Rails: None   Home Layout: Two level;Able to live on main level with bedroom/bathroom Home Equipment: Dan HumphreysWalker - 2 wheels;Bedside commode;Shower seat      Prior Function Level of Independence: Independent         Comments: has been limited in the amount of walking he can do and was not able to ride the care for long periods of time     Hand Dominance   Dominant Hand: Right    Extremity/Trunk Assessment   Upper Extremity Assessment Upper Extremity Assessment: Overall WFL for tasks assessed    Lower Extremity Assessment Lower Extremity Assessment: Overall WFL for tasks assessed    Cervical / Trunk Assessment Cervical / Trunk Assessment: Other exceptions Cervical / Trunk Exceptions: s/p lumbar sx  Communication   Communication: No difficulties  Cognition Arousal/Alertness: Awake/alert Behavior During Therapy: WFL for tasks assessed/performed Overall Cognitive Status: Within Functional Limits for  tasks assessed                                        General Comments General comments (skin integrity, edema, etc.): wife and son in room for session    Exercises     Assessment/Plan    PT Assessment Patent  does not need any further PT services  PT Problem List         PT Treatment Interventions      PT Goals (Current goals can be found in the Care Plan section)  Acute Rehab PT Goals Patient Stated Goal: "to get back to walking daily with my wife" PT Goal Formulation: All assessment and education complete, DC therapy    Frequency     Barriers to discharge        Co-evaluation               AM-PAC PT "6 Clicks" Daily Activity  Outcome Measure Difficulty turning over in bed (including adjusting bedclothes, sheets and blankets)?: None Difficulty moving from lying on back to sitting on the side of the bed? : None Difficulty sitting down on and standing up from a chair with arms (e.g., wheelchair, bedside commode, etc,.)?: None Help needed moving to and from a bed to chair (including a wheelchair)?: None Help needed walking in hospital room?: None Help needed climbing 3-5 steps with a railing? : A Little 6 Click Score: 23    End of Session Equipment Utilized During Treatment: Gait belt Activity Tolerance: Patient tolerated treatment well Patient left: in bed;with call bell/phone within reach Nurse Communication: Mobility status PT Visit Diagnosis: Pain;Difficulty in walking, not elsewhere classified (R26.2) Pain - part of body: (back)    Time: 1610-9604 PT Time Calculation (min) (ACUTE ONLY): 14 min   Charges:   PT Evaluation $PT Eval Low Complexity: 1 Low         Laurina Bustle, PT, DPT Acute Rehabilitation Services Pager [phone] Office [phone]  Vanetta Mulders 05/31/2018, 10:53 AM

## 2018-05-31 NOTE — Discharge Summary (Signed)
Physician Discharge Summary  Patient ID: Gilbert Maxwell MRN: [id number] DOB/AGE: [birth year] 84 y.o.  Admit date: 05/30/2018 Discharge date: 05/31/2018  Admission Diagnoses: lumbar stenosis    Discharge Diagnoses: same   Discharged Condition: good  Hospital Course: The patient was admitted on 05/30/2018 and taken to the operating room where the patient underwent LL for stenosis. The patient tolerated the procedure well and was taken to the recovery room and then to the floor in stable condition. The hospital course was routine. There were no complications. The wound remained clean dry and intact. Pt had appropriate back soreness. No complaints of leg pain or new N/T/W. The patient remained afebrile with stable vital signs, and tolerated a regular diet. The patient continued to increase activities, and pain was well controlled with oral pain medications.   Consults: None  Significant Diagnostic Studies:  Results for orders placed or performed during the hospital encounter of 05/28/18  Surgical pcr screen  Result Value Ref Range   MRSA, PCR NEGATIVE NEGATIVE   Staphylococcus aureus NEGATIVE NEGATIVE  Basic metabolic panel  Result Value Ref Range   Sodium 141 135 - 145 mmol/L   Potassium 4.6 3.5 - 5.1 mmol/L   Chloride 107 98 - 111 mmol/L   CO2 23 22 - 32 mmol/L   Glucose, Bld 100 (H) 70 - 99 mg/dL   BUN 27 (H) 8 - 23 mg/dL   Creatinine, Ser 4.09 (H) 0.61 - 1.24 mg/dL   Calcium 81.1 8.9 - 91.4 mg/dL   GFR calc non Af Amer 35 (L) >60 mL/min   GFR calc Af Amer 40 (L) >60 mL/min   Anion gap 11 5 - 15  CBC  Result Value Ref Range   WBC 11.3 (H) 4.0 - 10.5 K/uL   RBC 4.42 4.22 - 5.81 MIL/uL   Hemoglobin 14.2 13.0 - 17.0 g/dL   HCT 78.2 95.6 - 21.3 %   MCV 100.0 78.0 - 100.0 fL   MCH 32.1 26.0 - 34.0 pg   MCHC 32.1 30.0 - 36.0 g/dL   RDW 08.6 57.8 - 46.9 %   Platelets 185 150 - 400 K/uL    Ct Lumbar Spine W Contrast  Result Date: 05/09/2018 CLINICAL DATA:  Low back and  bilateral lower extremity pain, currently worse on the right. Prior lumbar surgery. EXAM: LUMBAR MYELOGRAM FLUOROSCOPY TIME:  Radiation Exposure Index (as provided by the fluoroscopic device): 141.96 microGray*m^2 Fluoroscopy Time (in minutes and seconds):  24 seconds PROCEDURE: After thorough discussion of risks and benefits of the procedure including bleeding, infection, injury to nerves, blood vessels, adjacent structures as well as headache and CSF leak, written and oral informed consent was obtained. Consent was obtained by Dr. Sebastian Ache. Time out form was completed. Patient was positioned prone on the fluoroscopy table. Local anesthesia was provided with 1% lidocaine without epinephrine after prepped and draped in the usual sterile fashion. Puncture was performed at L1-2 using a 3 1/2 inch 22-gauge spinal needle via a left paramedian approach. Using a single pass through the dura, the needle was placed within the thecal sac, with return of clear CSF. 15 mL of Isovue M-200 was injected into the thecal sac, with normal opacification of the nerve roots and cauda equina consistent with free flow within the subarachnoid space. I personally performed the lumbar puncture and administered the intrathecal contrast. I also personally supervised acquisition of the myelogram images. TECHNIQUE: Contiguous axial images were obtained through the Lumbar spine after the intrathecal infusion of infusion.  Coronal and sagittal reconstructions were obtained of the axial image sets. COMPARISON:  01/31/2018 lumbar spine MRI from [facility] FINDINGS: LUMBAR MYELOGRAM FINDINGS: There are 5 non rib-bearing lumbar type vertebrae. Grade 1 anterolisthesis of L4 on L5 and trace retrolisthesis of L2 on L3 and L3 on L4 do not significantly change with flexion or extension. Advanced disc degeneration is present from L2-3 to L5-S1. There is evidence of severe spinal stenosis at L2-3 with only minimal contrast extending to the L3-4  level. No contrast was visible below L3-4, suggesting severe spinal stenosis at that level as well. CT LUMBAR MYELOGRAM FINDINGS: Anterolisthesis of L4 on L5 measures 5 mm. There is trace retrolisthesis of L2 on L3 and L3 on L4, and there is mild levoscoliosis with apex at L3. No fracture or suspicious osseous lesion is identified. Advanced disc degeneration is present from L2-3 to L5-S1 with severe disc space narrowing, prominent degenerative endplate changes, and vacuum disc at each level. The conus medullaris terminates at L1. Cauda equina nerve root redundancy is noted proximal to the severe L2-3 spinal stenosis. Extensive abdominal aortic atherosclerosis is noted without aneurysm. Calcification centrally within the infrarenal abdominal aorta may reflect displaced intimal calcification in the setting of a short segment dissection or calcified mural thrombus. T12-L1: Diminutive central disc protrusion without stenosis, unchanged. L1-2: Minimal disc bulging without stenosis, unchanged. L2-3: Circumferential disc osteophyte complex and moderate to severe facet and ligamentum flavum hypertrophy result in severe spinal stenosis and mild bilateral neural foraminal stenosis, unchanged. Only a minimal amount subarachnoid contrast extends below this level. L3-4: Circumferential disc osteophyte complex and severe facet and ligamentum flavum hypertrophy result in severe spinal stenosis and moderate right and mild left neural foraminal stenosis, unchanged. No contrast extends below this level. L4-5: Prior laminectomies. Anterolisthesis with bulging uncovered disc and severe facet hypertrophy result in moderate bilateral neural foraminal stenosis, unchanged. There may be some residual lateral recess stenosis bilaterally, however assessment is limited by the lack of subarachnoid contrast. L5-S1: Prior laminectomies. Circumferential disc osteophyte complex results in moderate bilateral neural foraminal stenosis, unchanged. No  evidence of significant residual spinal stenosis. IMPRESSION: 1. Advanced lumbar disc degeneration with severe spinal stenosis at L2-3 and L3-4. Complete contrast block at L3-4. 2. Prior posterior decompression at L4-5 and L5-S1 with moderate bilateral neural foraminal stenosis at both levels. 3. Aortic Atherosclerosis (ICD10-I70.0). Central calcification in the lumen of the infrarenal abdominal aorta may reflect a short segment dissection or calcified mural thrombus. Slight aortic ectasia without frank aneurysm. Electronically Signed   By: Sebastian Ache M.D.   On: 05/09/2018 12:08   Dg Lumbar Spine 1 View  Result Date: 05/30/2018 CLINICAL DATA:  Lumbar laminectomy EXAM: LUMBAR SPINE - 1 VIEW COMPARISON:  CT 05/09/2018 FINDINGS: Lumbar numbering similar to CT from 05/09/2018. Single lateral view of the lumbar spine. Surgical instruments posterior to L3 with metallic localizing device posterior to L2-L3 disc space and L3-L4 disc space. IMPRESSION: Single lateral view of lumbar spine obtained for localization purposes during lumbar spine surgery. Electronically Signed   By: Jasmine Pang M.D.   On: 05/30/2018 15:15   Dg Myelography Lumbar Inj Lumbosacral  Result Date: 05/09/2018 CLINICAL DATA:  Low back and bilateral lower extremity pain, currently worse on the right. Prior lumbar surgery. EXAM: LUMBAR MYELOGRAM FLUOROSCOPY TIME:  Radiation Exposure Index (as provided by the fluoroscopic device): 141.96 microGray*m^2 Fluoroscopy Time (in minutes and seconds):  24 seconds PROCEDURE: After thorough discussion of risks and benefits of the procedure including  bleeding, infection, injury to nerves, blood vessels, adjacent structures as well as headache and CSF leak, written and oral informed consent was obtained. Consent was obtained by Dr. Sebastian Ache. Time out form was completed. Patient was positioned prone on the fluoroscopy table. Local anesthesia was provided with 1% lidocaine without epinephrine after prepped  and draped in the usual sterile fashion. Puncture was performed at L1-2 using a 3 1/2 inch 22-gauge spinal needle via a left paramedian approach. Using a single pass through the dura, the needle was placed within the thecal sac, with return of clear CSF. 15 mL of Isovue M-200 was injected into the thecal sac, with normal opacification of the nerve roots and cauda equina consistent with free flow within the subarachnoid space. I personally performed the lumbar puncture and administered the intrathecal contrast. I also personally supervised acquisition of the myelogram images. TECHNIQUE: Contiguous axial images were obtained through the Lumbar spine after the intrathecal infusion of infusion. Coronal and sagittal reconstructions were obtained of the axial image sets. COMPARISON:  01/31/2018 lumbar spine MRI from [facility] FINDINGS: LUMBAR MYELOGRAM FINDINGS: There are 5 non rib-bearing lumbar type vertebrae. Grade 1 anterolisthesis of L4 on L5 and trace retrolisthesis of L2 on L3 and L3 on L4 do not significantly change with flexion or extension. Advanced disc degeneration is present from L2-3 to L5-S1. There is evidence of severe spinal stenosis at L2-3 with only minimal contrast extending to the L3-4 level. No contrast was visible below L3-4, suggesting severe spinal stenosis at that level as well. CT LUMBAR MYELOGRAM FINDINGS: Anterolisthesis of L4 on L5 measures 5 mm. There is trace retrolisthesis of L2 on L3 and L3 on L4, and there is mild levoscoliosis with apex at L3. No fracture or suspicious osseous lesion is identified. Advanced disc degeneration is present from L2-3 to L5-S1 with severe disc space narrowing, prominent degenerative endplate changes, and vacuum disc at each level. The conus medullaris terminates at L1. Cauda equina nerve root redundancy is noted proximal to the severe L2-3 spinal stenosis. Extensive abdominal aortic atherosclerosis is noted without aneurysm. Calcification centrally  within the infrarenal abdominal aorta may reflect displaced intimal calcification in the setting of a short segment dissection or calcified mural thrombus. T12-L1: Diminutive central disc protrusion without stenosis, unchanged. L1-2: Minimal disc bulging without stenosis, unchanged. L2-3: Circumferential disc osteophyte complex and moderate to severe facet and ligamentum flavum hypertrophy result in severe spinal stenosis and mild bilateral neural foraminal stenosis, unchanged. Only a minimal amount subarachnoid contrast extends below this level. L3-4: Circumferential disc osteophyte complex and severe facet and ligamentum flavum hypertrophy result in severe spinal stenosis and moderate right and mild left neural foraminal stenosis, unchanged. No contrast extends below this level. L4-5: Prior laminectomies. Anterolisthesis with bulging uncovered disc and severe facet hypertrophy result in moderate bilateral neural foraminal stenosis, unchanged. There may be some residual lateral recess stenosis bilaterally, however assessment is limited by the lack of subarachnoid contrast. L5-S1: Prior laminectomies. Circumferential disc osteophyte complex results in moderate bilateral neural foraminal stenosis, unchanged. No evidence of significant residual spinal stenosis. IMPRESSION: 1. Advanced lumbar disc degeneration with severe spinal stenosis at L2-3 and L3-4. Complete contrast block at L3-4. 2. Prior posterior decompression at L4-5 and L5-S1 with moderate bilateral neural foraminal stenosis at both levels. 3. Aortic Atherosclerosis (ICD10-I70.0). Central calcification in the lumen of the infrarenal abdominal aorta may reflect a short segment dissection or calcified mural thrombus. Slight aortic ectasia without frank aneurysm. Electronically Signed   By:  Sebastian AcheAllen  Grady M.D.   On: 05/09/2018 12:08    Antibiotics:  Anti-infectives (From admission, onward)   Start     Dose/Rate Route Frequency Ordered Stop   05/30/18 1630   ceFAZolin (ANCEF) IVPB 2g/100 mL premix     2 g 200 mL/hr over 30 Minutes Intravenous Every 8 hours 05/30/18 1616 05/31/18 0944   05/30/18 1234  bacitracin 50,000 Units in sodium chloride 0.9 % 500 mL irrigation  Status:  Discontinued       As needed 05/30/18 1234 05/30/18 1431   05/30/18 1215  ceFAZolin (ANCEF) IVPB 2g/100 mL premix     2 g 200 mL/hr over 30 Minutes Intravenous On call to O.R. 05/30/18 1117 05/30/18 1242      Discharge Exam: Blood pressure 99/60, pulse 90, temperature 98.4 F (36.9 C), temperature source Oral, resp. rate 16, height 5\' 7"  (1.702 m), weight 81.1 kg, SpO2 95 %. Neurologic: Grossly normal Dressing dry  Discharge Medications:   Allergies as of 05/31/2018      Reactions   Indomethacin Nausea And Vomiting   Ace Inhibitors Nausea Only   Beta Adrenergic Blockers Nausea Only   dizzy      Medication List    STOP taking these medications   tamsulosin 0.4 MG Caps capsule Commonly known as:  FLOMAX     TAKE these medications   acetaminophen 325 MG tablet Commonly known as:  TYLENOL Take 650 mg by mouth at bedtime as needed for moderate pain.   acetaminophen 500 MG tablet Commonly known as:  TYLENOL Take 1 tablet (500 mg total) by mouth every 6 (six) hours as needed.   aspirin 81 MG tablet Take 81 mg by mouth daily.   atorvastatin 10 MG tablet Commonly known as:  LIPITOR Take 10 mg by mouth daily with lunch.   gabapentin 300 MG capsule Commonly known as:  NEURONTIN Take 300 mg by mouth at bedtime.   hydrochlorothiazide 25 MG tablet Commonly known as:  HYDRODIURIL Take 25 mg by mouth every morning.   levothyroxine 125 MCG tablet Commonly known as:  SYNTHROID, LEVOTHROID Take 125 mcg by mouth daily before breakfast.   multivitamin tablet Take 1 tablet by mouth daily.   olmesartan 40 MG tablet Commonly known as:  BENICAR Take 40 mg by mouth every morning.   ondansetron 4 MG tablet Commonly known as:  ZOFRAN Take 1 tablet (4 mg  total) by mouth every 6 (six) hours.   OVER THE COUNTER MEDICATION Apply 1 application topically daily as needed (muscle pain/cramps). Theraworx otc muscle rub   oxyCODONE 5 MG immediate release tablet Commonly known as:  Oxy IR/ROXICODONE Take 0.5 tablets (2.5 mg total) by mouth every 6 (six) hours as needed for severe pain. What changed:    how much to take  when to take this  reasons to take this       Disposition: home   Final Dx: DLL for stenosis  Discharge Instructions     Remove dressing in 72 hours   Complete by:  As directed    Call MD for:  difficulty breathing, headache or visual disturbances   Complete by:  As directed    Call MD for:  persistant nausea and vomiting   Complete by:  As directed    Call MD for:  redness, tenderness, or signs of infection (pain, swelling, redness, odor or green/yellow discharge around incision site)   Complete by:  As directed    Call MD for:  severe uncontrolled  pain   Complete by:  As directed    Call MD for:  temperature >100.4   Complete by:  As directed    Diet - low sodium heart healthy   Complete by:  As directed    Increase activity slowly   Complete by:  As directed       Follow-up Information    Tia Alert, MD. Schedule an appointment as soon as possible for a visit in 2 week(s).   Specialty:  Neurosurgery Contact information: [address] [place] [address] [phone]            Signed: Tia Alert 05/31/2018, 10:14 AM

## 2018-06-01 NOTE — Anesthesia Postprocedure Evaluation (Signed)
Anesthesia Post Note  Patient: Rande BruntCharles M President  Procedure(s) Performed: Lumbar Two-Three Lumbar Three-Four Laminectomy/Foraminotomy (N/A Back)     Patient location during evaluation: PACU Anesthesia Type: General Level of consciousness: awake and alert Pain management: pain level controlled Vital Signs Assessment: post-procedure vital signs reviewed and stable Respiratory status: spontaneous breathing, nonlabored ventilation, respiratory function stable and patient connected to nasal cannula oxygen Cardiovascular status: blood pressure returned to baseline and stable Postop Assessment: no apparent nausea or vomiting Anesthetic complications: no    Last Vitals:  Vitals:   05/31/18 0355 05/31/18 0754  BP: 112/68 99/60  Pulse: 68 90  Resp: 18 16  Temp: (!) 36.3 C 36.9 C  SpO2: 95% 95%    Last Pain:  Vitals:   05/31/18 1045  TempSrc:   PainSc: 4                  Djuna Frechette

## 2018-08-03 ENCOUNTER — Other Ambulatory Visit: Payer: Self-pay | Admitting: Neurological Surgery

## 2018-08-03 DIAGNOSIS — M4807 Spinal stenosis, lumbosacral region: Secondary | ICD-10-CM

## 2018-08-18 ENCOUNTER — Ambulatory Visit
Admission: RE | Admit: 2018-08-18 | Discharge: 2018-08-18 | Disposition: A | Discharge status: Home / Self Care | Payer: Medicare Other | Source: Ambulatory Visit | Attending: Neurological Surgery | Admitting: Neurological Surgery

## 2018-08-18 DIAGNOSIS — M4807 Spinal stenosis, lumbosacral region: Secondary | ICD-10-CM

## 2018-08-18 MED ORDER — GADOBENATE DIMEGLUMINE 529 MG/ML IV SOLN
8.0000 mL | Freq: Once | INTRAVENOUS | Status: AC | PRN
  Administered 2018-08-18: 8 mL via INTRAVENOUS

## 2018-10-08 DIAGNOSIS — M79672 Pain in left foot: Secondary | ICD-10-CM | POA: Insufficient documentation

## 2018-10-19 ENCOUNTER — Ambulatory Visit: Payer: Medicare Other | Admitting: Podiatry

## 2018-10-19 ENCOUNTER — Ambulatory Visit (INDEPENDENT_AMBULATORY_CARE_PROVIDER_SITE_OTHER): Payer: Medicare Other

## 2018-10-19 DIAGNOSIS — M7750 Other enthesopathy of unspecified foot: Secondary | ICD-10-CM

## 2018-10-19 DIAGNOSIS — M7751 Other enthesopathy of right foot: Secondary | ICD-10-CM

## 2018-10-19 DIAGNOSIS — M79675 Pain in left toe(s): Secondary | ICD-10-CM

## 2018-10-19 DIAGNOSIS — M7989 Other specified soft tissue disorders: Secondary | ICD-10-CM

## 2018-10-19 DIAGNOSIS — M7752 Other enthesopathy of left foot: Secondary | ICD-10-CM | POA: Diagnosis not present

## 2018-10-19 DIAGNOSIS — M79674 Pain in right toe(s): Secondary | ICD-10-CM | POA: Diagnosis not present

## 2018-10-19 DIAGNOSIS — M1A9XX1 Chronic gout, unspecified, with tophus (tophi): Secondary | ICD-10-CM | POA: Diagnosis not present

## 2018-10-19 NOTE — Progress Notes (Signed)
Subjective:  Patient ID: Gilbert Maxwell, male    DOB: 02-Nov-1932,  MRN: [id number]  Chief Complaint  Patient presents with  . Foot Problem    bilateral foot pain    85 y.o. male presents with the above complaint.  Reports pain to the outside of both feet and the inside of the right big toe states that the joints are swollen as well.  Thinks that it could be a nerve problem states that it started in the left foot then moved to the right foot states that it is hurts by walking on it and also when he is laying in his bed at the end of the day.  Been hurting for 8 to 9 months and gradually getting worse.  Review of Systems: Negative except as noted in the HPI. Denies N/V/F/Ch.  Past Medical History:  Diagnosis Date  . Abnormal cardiovascular stress test 6/11   No symptoms- medical Rx  . Anxiety   . Depression   . DJD (degenerative joint disease)   . Dyslipidemia   . GERD (gastroesophageal reflux disease)    hx of   . HTN (hypertension)   . Hypothyroid   . Stroke [facility](H[facility])    small " mini stroke" - 2002     Current Outpatient Medications:  .  aspirin 81 MG tablet, Take 81 mg by mouth daily., Disp: , Rfl:  .  atorvastatin (LIPITOR) 10 MG tablet, Take 10 mg by mouth daily with lunch. , Disp: , Rfl: 0 .  gabapentin (NEURONTIN) 300 MG capsule, Take 300 mg by mouth at bedtime. , Disp: , Rfl: 0 .  hydrochlorothiazide (HYDRODIURIL) 25 MG tablet, Take 25 mg by mouth every morning. , Disp: , Rfl:  .  levothyroxine (SYNTHROID, LEVOTHROID) 125 MCG tablet, Take 125 mcg by mouth daily before breakfast. , Disp: , Rfl:  .  Multiple Vitamin (MULTIVITAMIN) tablet, Take 1 tablet by mouth daily., Disp: , Rfl:  .  olmesartan (BENICAR) 40 MG tablet, Take 40 mg by mouth every morning. , Disp: , Rfl:  .  OVER THE COUNTER MEDICATION, Apply 1 application topically daily as needed (muscle pain/cramps). Theraworx otc muscle rub, Disp: , Rfl:  .  oxyCODONE (ROXICODONE) 5 MG immediate release tablet, Take 0.5  tablets (2.5 mg total) by mouth every 6 (six) hours as needed for severe pain. (Patient taking differently: Take 10 mg by mouth every 6 (six) hours as needed for severe pain. ), Disp: 8 tablet, Rfl: 0 .  pramipexole (MIRAPEX) 0.125 MG tablet, Take 0.125 mg by mouth 3 (three) times daily., Disp: , Rfl:  .  acetaminophen (TYLENOL) 325 MG tablet, Take 650 mg by mouth at bedtime as needed for moderate pain., Disp: , Rfl:  .  acetaminophen (TYLENOL) 500 MG tablet, Take 1 tablet (500 mg total) by mouth every 6 (six) hours as needed. (Patient not taking: Reported on 05/24/2018), Disp: 30 tablet, Rfl: 0 .  ondansetron (ZOFRAN) 4 MG tablet, Take 1 tablet (4 mg total) by mouth every 6 (six) hours. (Patient not taking: Reported on 04/26/2018), Disp: 12 tablet, Rfl: 0  Social History   Tobacco Use  Smoking Status Former Smoker  Smokeless Tobacco Never Used  Tobacco Comment   quit about 25 yrs. ago    Allergies  Allergen Reactions  . Indomethacin Nausea And Vomiting  . Ace Inhibitors Nausea Only  . Beta Adrenergic Blockers Nausea Only    dizzy   Objective:  There were no vitals filed for this visit. There  is no height or weight on file to calculate BMI. Constitutional Well developed. Well nourished.  Vascular Dorsalis pedis pulses palpable bilaterally. Posterior tibial pulses palpable bilaterally. Capillary refill normal to all digits.  No cyanosis or clubbing noted. Pedal hair growth normal.  Neurologic Normal speech. Oriented to person, place, and time. Epicritic sensation to light touch grossly present bilaterally.  Dermatologic Nails well groomed and normal in appearance. No open wounds. No skin lesions.  Orthopedic: POP R 5th MPJ bilat, R 1st MPJ with prominent 5th MPJs   Radiographs: Taken and reviewed no acute fracture dislocations or soft tissue density noted about the fifth MPJ bilaterally, first MPJ right Assessment:   1. Capsulitis of metatarsophalangeal (MTP) joint   2. Pain  and swelling of toe of left foot   3. Pain and swelling of toe of right foot   4. 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  Capsulitis Bilat, poss Gout -X-rays reviewed as above -Discussed with patient possible etiologies including chronic gout which could relate to tophaceous-like deposits of the joints -Injections delivered to bilateral fifth MPJ -Patient to follow-up in 3 weeks to see if the pain is improving  Procedure: Joint Injection Location: Bilateral 5th MP joint Skin Prep: Alcohol. Injectate: 0.5 cc 1% lidocaine plain, 0.5 cc dexamethasone phosphate. Disposition: Patient tolerated procedure well. Injection site dressed with a band-aid.   Return in about 3 weeks (around 11/09/2018) for Gout f/u .

## 2018-11-08 ENCOUNTER — Ambulatory Visit: Payer: Medicare Other | Admitting: Podiatry

## 2018-11-08 DIAGNOSIS — M7751 Other enthesopathy of right foot: Secondary | ICD-10-CM | POA: Diagnosis not present

## 2018-11-08 DIAGNOSIS — M7752 Other enthesopathy of left foot: Secondary | ICD-10-CM

## 2018-11-08 DIAGNOSIS — M7750 Other enthesopathy of unspecified foot: Secondary | ICD-10-CM

## 2018-11-17 NOTE — Progress Notes (Signed)
Subjective:  Patient ID: Gilbert Maxwell, male    DOB: [birth year],  MRN: [id number]  Chief Complaint  Patient presents with  . Foot Problem    3 weeks f/u on foot pain, pt states that not much has changed, pt feels like he is stepping on rocks, injection he recieved last time has helped, but is still feeling pain    85 y.o. male presents with the above complaint.  Reports pain to the outside of both feet and the inside of the right big toe states that the joints are swollen as well.  Thinks that it could be a nerve problem states that it started in the left foot then moved to the right foot states that it is hurts by walking on it and also when he is laying in his bed at the end of the day.  Been hurting for 8 to 9 months and gradually getting worse.  Review of Systems: Negative except as noted in the HPI. Denies N/V/F/Ch.  Past Medical History:  Diagnosis Date  . Abnormal cardiovascular stress test 6/11   No symptoms- medical Rx  . Anxiety   . Depression   . DJD (degenerative joint disease)   . Dyslipidemia   . GERD (gastroesophageal reflux disease)    hx of   . HTN (hypertension)   . Hypothyroid   . Stroke [facility])    small " mini stroke" - 2002     Current Outpatient Medications:  .  acetaminophen (TYLENOL) 325 MG tablet, Take 650 mg by mouth at bedtime as needed for moderate pain., Disp: , Rfl:  .  acetaminophen (TYLENOL) 500 MG tablet, Take 1 tablet (500 mg total) by mouth every 6 (six) hours as needed., Disp: 30 tablet, Rfl: 0 .  aspirin 81 MG tablet, Take 81 mg by mouth daily., Disp: , Rfl:  .  atorvastatin (LIPITOR) 10 MG tablet, Take 10 mg by mouth daily with lunch. , Disp: , Rfl: 0 .  gabapentin (NEURONTIN) 300 MG capsule, Take 300 mg by mouth at bedtime. , Disp: , Rfl: 0 .  hydrochlorothiazide (HYDRODIURIL) 25 MG tablet, Take 25 mg by mouth every morning. , Disp: , Rfl:  .  levothyroxine (SYNTHROID, LEVOTHROID) 125 MCG tablet, Take 125 mcg by mouth daily before  breakfast. , Disp: , Rfl:  .  Multiple Vitamin (MULTIVITAMIN) tablet, Take 1 tablet by mouth daily., Disp: , Rfl:  .  olmesartan (BENICAR) 40 MG tablet, Take 40 mg by mouth every morning. , Disp: , Rfl:  .  ondansetron (ZOFRAN) 4 MG tablet, Take 1 tablet (4 mg total) by mouth every 6 (six) hours., Disp: 12 tablet, Rfl: 0 .  OVER THE COUNTER MEDICATION, Apply 1 application topically daily as needed (muscle pain/cramps). Theraworx otc muscle rub, Disp: , Rfl:  .  oxyCODONE (ROXICODONE) 5 MG immediate release tablet, Take 0.5 tablets (2.5 mg total) by mouth every 6 (six) hours as needed for severe pain. (Patient taking differently: Take 10 mg by mouth every 6 (six) hours as needed for severe pain. ), Disp: 8 tablet, Rfl: 0 .  pramipexole (MIRAPEX) 0.125 MG tablet, Take 0.125 mg by mouth 3 (three) times daily., Disp: , Rfl:   Social History   Tobacco Use  Smoking Status Former Smoker  Smokeless Tobacco Never Used  Tobacco Comment   quit about 25 yrs. ago    Allergies  Allergen Reactions  . Indomethacin Nausea And Vomiting  . Ace Inhibitors Nausea Only  . Beta Adrenergic Blockers Nausea  Only    dizzy   Objective:  There were no vitals filed for this visit. There is no height or weight on file to calculate BMI. Constitutional Well developed. Well nourished.  Vascular Dorsalis pedis pulses palpable bilaterally. Posterior tibial pulses palpable bilaterally. Capillary refill normal to all digits.  No cyanosis or clubbing noted. Pedal hair growth normal.  Neurologic Normal speech. Oriented to person, place, and time. Epicritic sensation to light touch grossly present bilaterally.  Dermatologic Nails well groomed and normal in appearance. No open wounds. No skin lesions.  Orthopedic: POP R 5th MPJ bilat, R 1st MPJ with prominent 5th MPJs   Radiographs: Taken and reviewed no acute fracture dislocations or soft tissue density noted about the fifth MPJ bilaterally, first MPJ  right Assessment:   1. Capsulitis of metatarsophalangeal (MTP) joint    Plan:  Patient was evaluated and treated and all questions answered.  Capsulitis Bilat, poss Gout -X-rays reviewed as above -Discussed with patient possible etiologies including chronic gout which could relate to tophaceous-like deposits of the joints -Injections delivered to bilateral fifth MPJ -Patient to follow-up in 3 weeks to see if the pain is improving  Procedure: Joint Injection Location: Bilateral 5th MP joint Skin Prep: Alcohol. Injectate: 0.5 cc 1% lidocaine plain, 0.5 cc dexamethasone phosphate. Disposition: Patient tolerated procedure well. Injection site dressed with a band-aid.   No follow-ups on file.

## 2018-12-20 ENCOUNTER — Ambulatory Visit: Payer: Medicare Other | Admitting: Podiatry

## 2019-01-28 ENCOUNTER — Encounter: Payer: Self-pay | Admitting: Podiatry

## 2019-01-28 ENCOUNTER — Ambulatory Visit: Payer: Medicare Other | Admitting: Podiatry

## 2019-01-28 ENCOUNTER — Other Ambulatory Visit: Payer: Self-pay

## 2019-01-28 VITALS — Temp 97.9°F

## 2019-01-28 DIAGNOSIS — M5416 Radiculopathy, lumbar region: Secondary | ICD-10-CM

## 2019-01-28 DIAGNOSIS — M19079 Primary osteoarthritis, unspecified ankle and foot: Secondary | ICD-10-CM | POA: Diagnosis not present

## 2019-01-28 DIAGNOSIS — M5440 Lumbago with sciatica, unspecified side: Secondary | ICD-10-CM

## 2019-01-28 DIAGNOSIS — M722 Plantar fascial fibromatosis: Secondary | ICD-10-CM | POA: Diagnosis not present

## 2019-01-28 DIAGNOSIS — G8929 Other chronic pain: Secondary | ICD-10-CM

## 2019-01-28 NOTE — Progress Notes (Signed)
   Subjective: 85 y.o. male presenting today with a chief complaint of bilateral foot pain that began about three months ago. He describes the pain as sharp and states it is across the ball the feet to the plantar aspect of the heels. He states he was told the pain was coming from a gout exacerbation but he states this pain is different from any gout pain he has had in the past. He has received injections which have provided no significant relief. Walking increases the pain. Patient is here for further evaluation and treatment.   Past Medical History:  Diagnosis Date  . Abnormal cardiovascular stress test 6/11   No symptoms- medical Rx  . Anxiety   . Depression   . DJD (degenerative joint disease)   . Dyslipidemia   . GERD (gastroesophageal reflux disease)    hx of   . HTN (hypertension)   . Hypothyroid   . Stroke [facility])    small " mini stroke" - 2002      Objective: Physical Exam General: The patient is alert and oriented x3 in no acute distress.  Dermatology: Skin is warm, dry and supple bilateral lower extremities. Negative for open lesions or macerations bilateral.   Vascular: Dorsalis Pedis and Posterior Tibial pulses palpable bilateral.  Capillary fill time is immediate to all digits.  Neurological: Epicritic and protective threshold intact bilateral.   Musculoskeletal: Tenderness to palpation to the plantar aspect of the bilateral heels along the plantar fascia. All other joints range of motion within normal limits bilateral. Strength 5/5 in all groups bilateral.   Assessment: 1. plantar fasciitis bilateral feet 2. Lumbar radiculopathy with chronic back pain  Plan of Care:  1. Patient evaluated. X   2. Injection of 0.5cc Celestone soluspan injected into the bilateral heels.  3. Continue taking Percocet 10 mg from [facility] as needed for chronic back pain.  4. Continue taking Gabapentin 300 mg from [facility] as needed for chronic back pain.   5. Recommended custom orthotics but patient will try OTC insoles/arch supports first.  6. Return to clinic in 4 weeks.     Felecia Shelling, DPM [facility]  Dr. Felecia Shelling, DPM    [address] [place], [place] [address]                Office [phone]  Fax [phone]

## 2019-02-25 ENCOUNTER — Ambulatory Visit: Payer: Medicare Other | Admitting: Podiatry

## 2020-10-11 IMAGING — MR MR LUMBAR SPINE WO/W CM
4 of 7 series · 19 of 48 positions shown · IV contrast (Multihance 8ml)
Comparison: Postmyelogram CT scan of the lumbar spine 05/09/2018.
Lumbar spine MRI 01/31/2018.

CLINICAL DATA: History of prior lumbar surgery x3, most recently
05/30/2018. Worsening low back and bilateral lower extremity pain
since the patient's most recent surgery. No known injury.

Creatinine was obtained on site at [HOSPITAL] at [HOSPITAL].
Results: Creatinine 1.8 mg/dL.
EXAM:
MRI LUMBAR SPINE WITHOUT AND WITH CONTRAST
TECHNIQUE: Multiplanar and multiecho pulse sequences of the lumbar spine were
obtained without and with intravenous contrast.
CONTRAST:  8 mL MULTIHANCE GADOBENATE DIMEGLUMINE 529 MG/ML IV SOLN

[Series 6: T1 · sagittal · 4.0mm · 0.73mm/px · 3 of 17 slices shown]
[im 1/17]
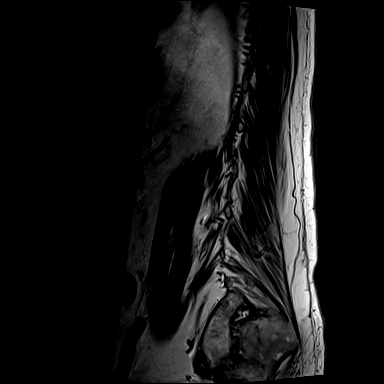
[im 11/17]
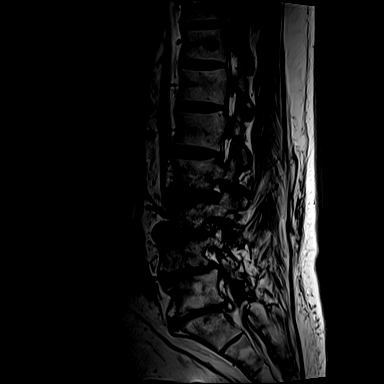
[im 17/17]
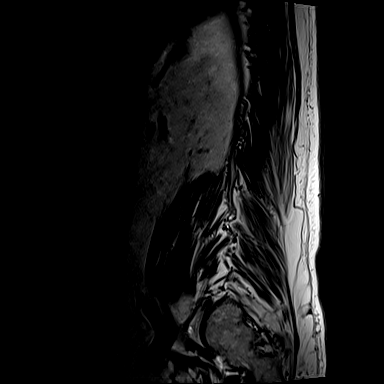

[Series 12: T2 · axial · 4.0mm · 0.28mm/px · z∈[-26,+169]mm · 8 of 37 slices shown (1 of 2)]
[im 1/37]
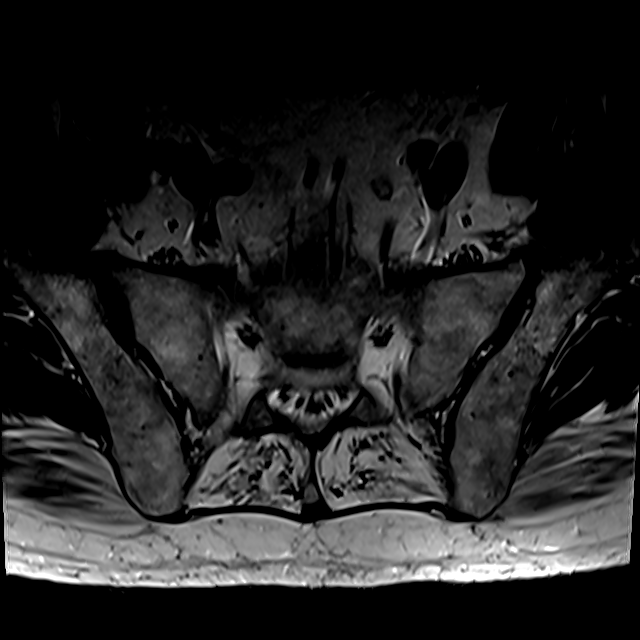
[im 5/37]
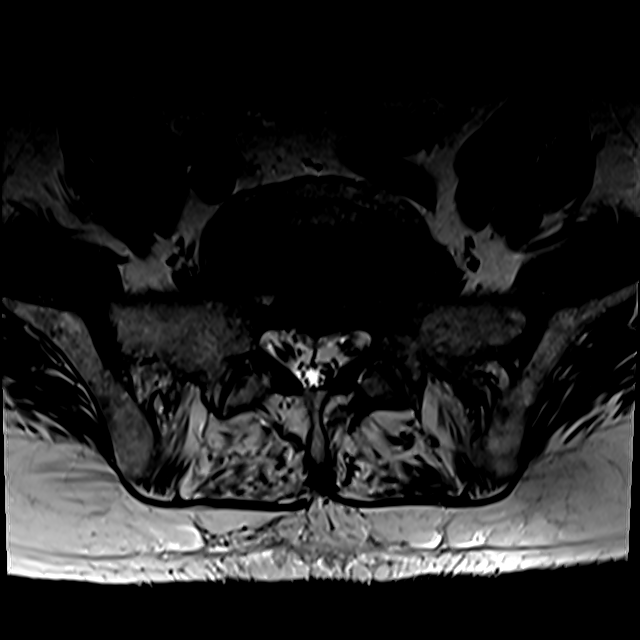
[im 13/37]
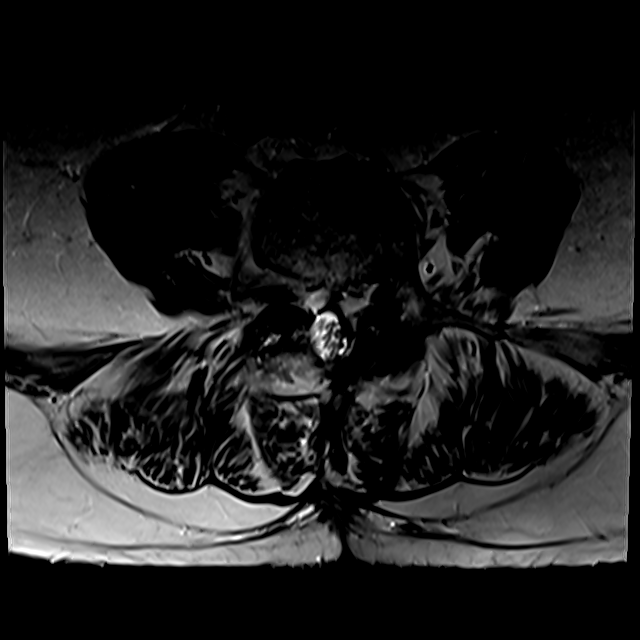
[im 17/37]
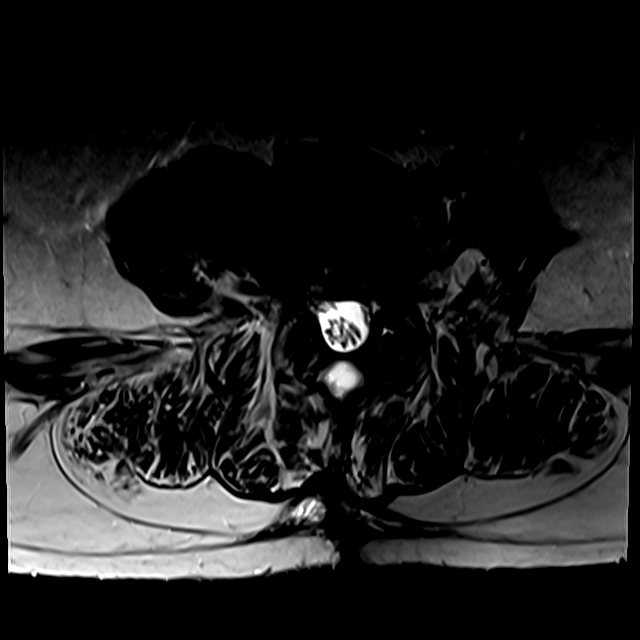
[im 21/37]
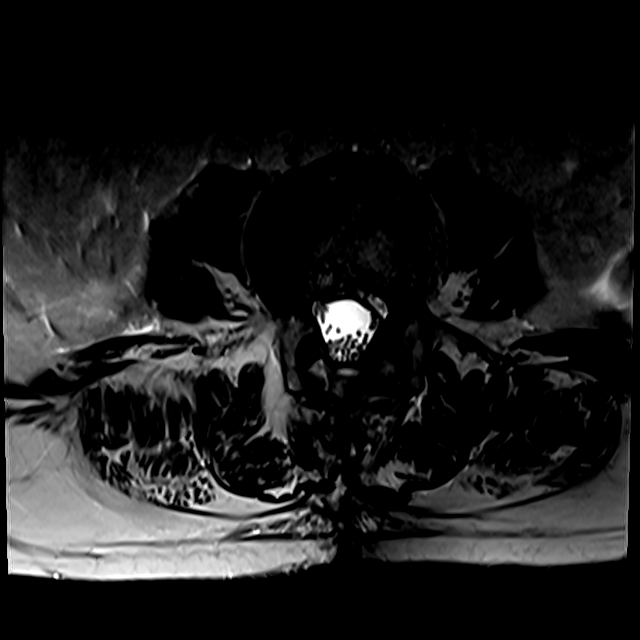
[im 25/37]
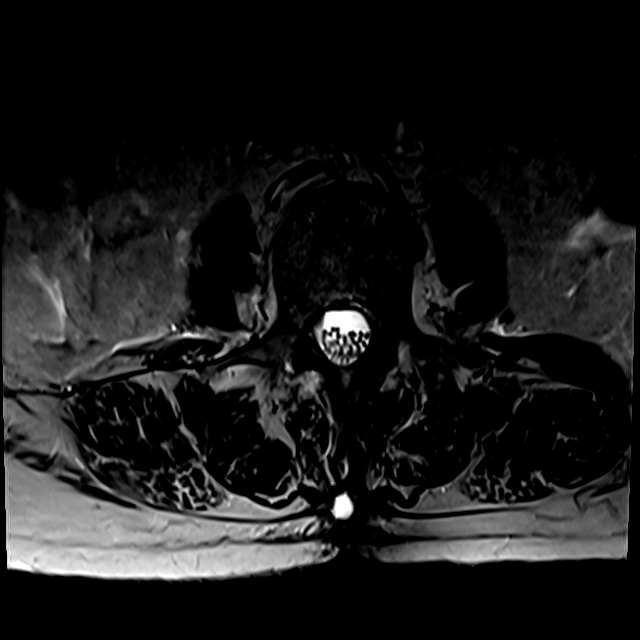
[im 33/37]
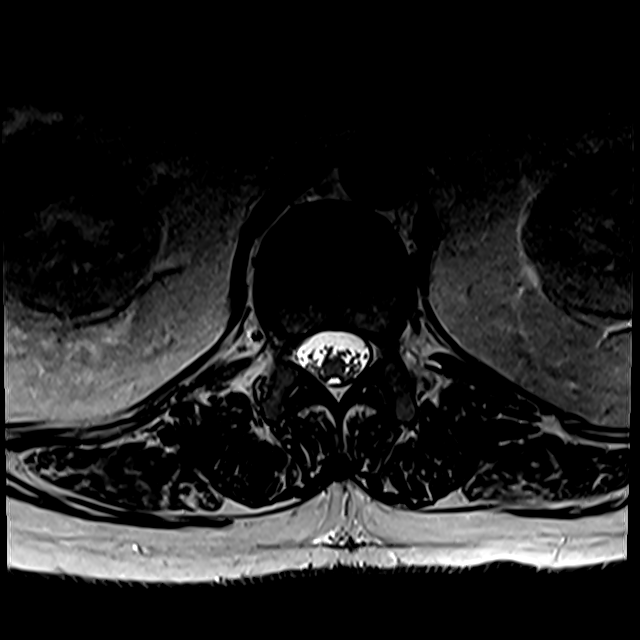
[im 37/37]
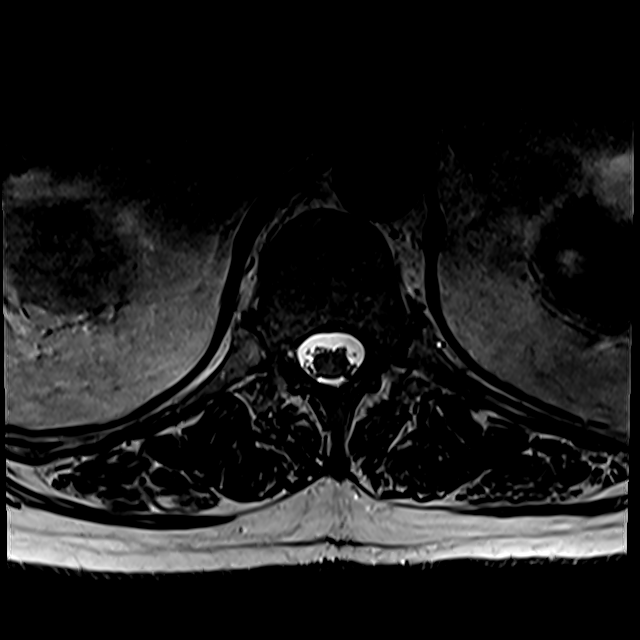

[Series 13: T2 · sagittal · 4.0mm · 0.73mm/px · 5 of 17 slices shown (2 of 2)]
[im 1/17]
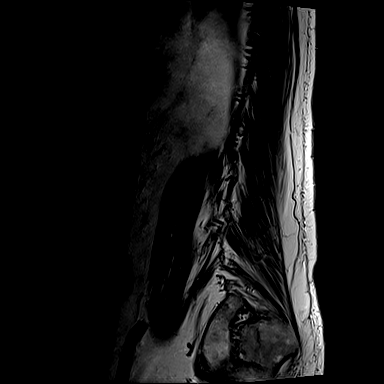
[im 5/17]
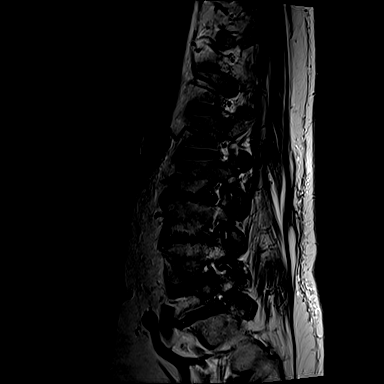
[im 9/17]
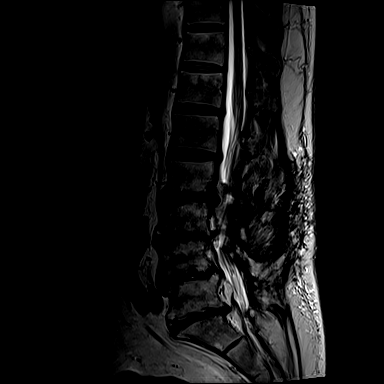
[im 13/17]
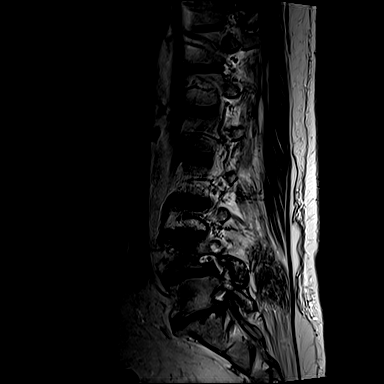
[im 17/17]
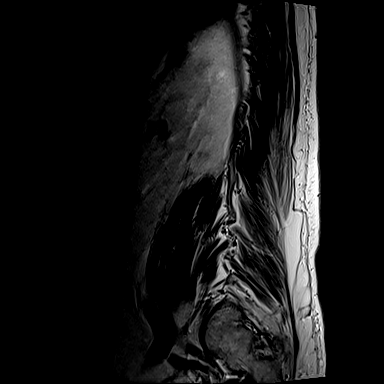

[Series 14: T1 fat-sat post-contrast · sagittal · 4.0mm · 0.73mm/px · 3 of 17 slices shown]
[im 1/17]
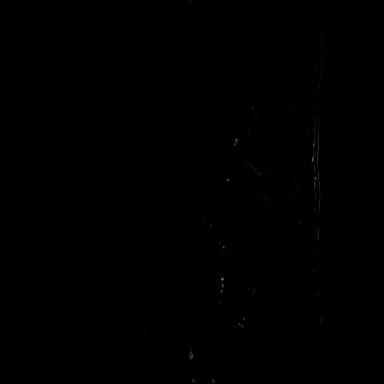
[im 9/17]
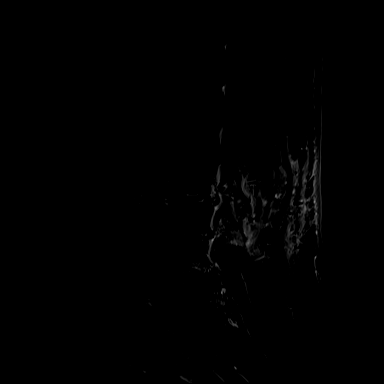
[im 17/17]
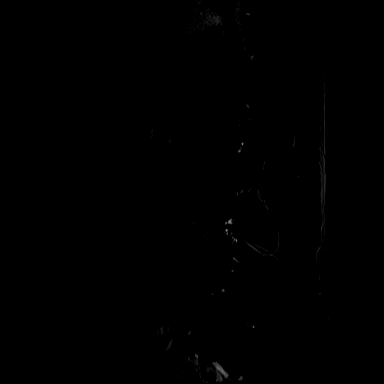

[19 of 48 positions shown; findings below may reference images not displayed]

FINDINGS: Segmentation:  Standard.

Alignment: There is straightening of the normal lumbar lordosis and
0.5 cm anterolisthesis L4 on L5, unchanged.

Vertebrae: No fracture. Degenerative endplate signal change L2-S1 is
identified. No evidence of discitis or osteomyelitis.

Conus medullaris and cauda equina: Conus extends to the L1 level.
Conus and cauda equina appear normal.

Paraspinal and other soft tissues: Negative.

Disc levels:

T10-11 and T11-12 are imaged in the sagittal plane only and
negative.

T12-L1: Tiny right paracentral protrusion without stenosis.

L1-2: Minimal disc bulge and mild-to-moderate facet degenerative
change. No stenosis.

L2-3: The patient has undergone posterior decompression since the
prior exams. The thecal sac is widely patent. There is a shallow
broad-based disc bulge. Mild to moderate bilateral foraminal
narrowing is seen.

L3-4: The patient has undergone interval posterior decompression.
There is a small fluid collection in the laminectomy bed which
measures 1 cm transverse by 0.7 cm AP x 1.1 cm craniocaudal and is
likely a seroma. The central canal is widely patent. Disc and
endplate spur cause moderate to moderately severe foraminal
narrowing, worse on the right. Marrow edema in the right L3 pedicle
is most consistent with stress change related to facet arthropathy.

L4-5: The patient is status post posterior decompression is seen on
the prior exam. Facet arthropathy is worse on the left. The central
canal is open. Mild to moderate foraminal narrowing is worse on the
left.

L5-S1: Status post posterior decompression. Disc and endplate spur
scratch the broad-based disc bulge and endplate spur are seen. The
central canal is open. Moderate bilateral foraminal narrowing is
seen.
IMPRESSION: Status post L2-3 and L3-4 posterior decompression since the prior
examination. The central canal is now widely patent at both levels.
There is some residual foraminal narrowing at both levels, more
notable at L3-4 where it is moderate to moderately severe and worse
on the right.

Very small fluid collection laminectomy bed at L3-4 is likely a
postoperative seroma.

Prior L4-5 and L5-S1 decompression as seen on the prior studies. The
central canal is open at both levels with some residual foraminal
narrowing more notable at L5-S1 where it is moderate in degree but
unchanged.

## 2022-04-19 DEATH — deceased
